# Patient Record
Sex: Female | Born: 1940 | Race: White | Hispanic: No | Smoking: Never smoker
Health system: Southern US, Community
[De-identification: ages and names within clinical notes are randomized; demographics above are authoritative.]

## PROBLEM LIST (undated history)

## (undated) DIAGNOSIS — C50912 Malignant neoplasm of unspecified site of left female breast: Secondary | ICD-10-CM

## (undated) DIAGNOSIS — M1990 Unspecified osteoarthritis, unspecified site: Secondary | ICD-10-CM

## (undated) DIAGNOSIS — I10 Essential (primary) hypertension: Secondary | ICD-10-CM

## (undated) HISTORY — PX: BREAST BIOPSY: SHX20

## (undated) HISTORY — PX: BUNIONECTOMY: SHX129

## (undated) HISTORY — PX: TONSILLECTOMY: SUR1361

## (undated) HISTORY — PX: CYST EXCISION: SHX5701

---

## 2000-05-02 ENCOUNTER — Encounter: Admission: RE | Admit: 2000-05-02 | Discharge: 2000-05-02 | Payer: Self-pay | Admitting: Internal Medicine

## 2000-05-02 ENCOUNTER — Encounter: Payer: Self-pay | Admitting: Internal Medicine

## 2001-05-15 ENCOUNTER — Encounter: Payer: Self-pay | Admitting: Internal Medicine

## 2001-05-15 ENCOUNTER — Encounter: Admission: RE | Admit: 2001-05-15 | Discharge: 2001-05-15 | Payer: Self-pay | Admitting: Internal Medicine

## 2002-02-05 ENCOUNTER — Ambulatory Visit (HOSPITAL_COMMUNITY): Admission: RE | Admit: 2002-02-05 | Discharge: 2002-02-05 | Payer: Self-pay | Admitting: *Deleted

## 2002-06-21 ENCOUNTER — Encounter: Admission: RE | Admit: 2002-06-21 | Discharge: 2002-06-21 | Payer: Self-pay | Admitting: *Deleted

## 2003-07-22 ENCOUNTER — Encounter: Admission: RE | Admit: 2003-07-22 | Discharge: 2003-07-22 | Payer: Self-pay | Admitting: Internal Medicine

## 2004-09-09 ENCOUNTER — Encounter: Admission: RE | Admit: 2004-09-09 | Discharge: 2004-09-09 | Payer: Self-pay | Admitting: Internal Medicine

## 2005-10-06 ENCOUNTER — Encounter: Admission: RE | Admit: 2005-10-06 | Discharge: 2005-10-06 | Payer: Self-pay | Admitting: Internal Medicine

## 2006-10-26 ENCOUNTER — Encounter: Admission: RE | Admit: 2006-10-26 | Discharge: 2006-10-26 | Payer: Self-pay | Admitting: Internal Medicine

## 2007-11-20 ENCOUNTER — Encounter: Admission: RE | Admit: 2007-11-20 | Discharge: 2007-11-20 | Payer: Self-pay | Admitting: Internal Medicine

## 2008-12-04 ENCOUNTER — Encounter: Admission: RE | Admit: 2008-12-04 | Discharge: 2008-12-04 | Payer: Self-pay | Admitting: Internal Medicine

## 2010-01-06 ENCOUNTER — Encounter: Admission: RE | Admit: 2010-01-06 | Discharge: 2010-01-06 | Payer: Self-pay | Admitting: Internal Medicine

## 2010-10-29 NOTE — Op Note (Signed)
   NAME:  Beth [name], Beth [name]                          ACCOUNT NO.:  [id number]   MEDICAL RECORD NO.:  [id number]                   PATIENT TYPE:  AMB   LOCATION:  ENDO                                 FACILITY:  [facility]   PHYSICIAN:  Georgiana Spinner, M.D.                 DATE OF BIRTH:  [birth year]   DATE OF PROCEDURE:  02/05/2002  DATE OF DISCHARGE:  02/05/2002                                 OPERATIVE REPORT   PROCEDURE:  Colonoscopy.   INDICATIONS:  Hemoccult positivity.   ANESTHESIA:  Demerol 90, Versed 9 mg.   PROCEDURE:  With the patient mildly sedated in the left lateral decubitus  position, a rectal examination was performed which revealed trace positive  material.  Subsequently the Olympus videoscopic colonoscope was inserted in  the rectum and passed under direct vision through a tortuous colon to the  cecum, identified by the ileocecal valve and appendiceal orifice both of  which were photographed.  From this point, the colonoscope was slowly  withdrawn to encircle around through his colonic mucosa stopping only in the  rectum which appeared normal.  Internal hemorrhoids on retroflex view and  the etiology of the hemoccult positivity may have been there is that there  was a small fissure.  The patient's vital signs and pulse oximeter remained  stable.  The patient tolerated the procedure well without apparent  complication.   FINDINGS:  1. Internal hemorrhoids.  2. Irritation of questionable fissure.  3. Tortuous colon.  4. Otherwise unremarkable examination.   PLAN:  The patient will followup with me as an outpatient.                                               Georgiana Spinner, M.D.    GMO/MEDQ  D:  02/05/2002  T:  02/07/2002  Job:  [id number]

## 2010-10-29 NOTE — Op Note (Signed)
   NAME:  Beth [name], Beth [name]                          ACCOUNT NO.:  [id number]   MEDICAL RECORD NO.:  [id number]                   PATIENT TYPE:  AMB   LOCATION:  ENDO                                 FACILITY:  [facility]   PHYSICIAN:  Georgiana Spinner, M.D.                 DATE OF BIRTH:  04/19/1941   DATE OF PROCEDURE:  02/05/2002  DATE OF DISCHARGE:                                 OPERATIVE REPORT   ADDENDUM   Add carbon copy.                                               Georgiana Spinner, M.D.    GMO/MEDQ  D:  02/05/2002  T:  02/07/2002  Job:  [id number]   cc:   Lacretia Leigh. Quintella Reichert, M.D.

## 2011-01-12 ENCOUNTER — Other Ambulatory Visit: Payer: Self-pay | Admitting: Internal Medicine

## 2011-01-12 DIAGNOSIS — Z1231 Encounter for screening mammogram for malignant neoplasm of breast: Secondary | ICD-10-CM

## 2011-01-26 ENCOUNTER — Ambulatory Visit
Admission: RE | Admit: 2011-01-26 | Discharge: 2011-01-26 | Disposition: A | Discharge status: Home / Self Care | Payer: Medicare Other | Source: Ambulatory Visit | Attending: Internal Medicine | Admitting: Internal Medicine

## 2011-01-26 DIAGNOSIS — Z1231 Encounter for screening mammogram for malignant neoplasm of breast: Secondary | ICD-10-CM

## 2011-08-17 DIAGNOSIS — E785 Hyperlipidemia, unspecified: Secondary | ICD-10-CM | POA: Diagnosis not present

## 2011-08-17 DIAGNOSIS — I10 Essential (primary) hypertension: Secondary | ICD-10-CM | POA: Diagnosis not present

## 2011-08-17 DIAGNOSIS — E559 Vitamin D deficiency, unspecified: Secondary | ICD-10-CM | POA: Diagnosis not present

## 2011-08-24 DIAGNOSIS — I10 Essential (primary) hypertension: Secondary | ICD-10-CM | POA: Diagnosis not present

## 2011-08-24 DIAGNOSIS — Z0000 Encounter for general adult medical examination without abnormal findings: Secondary | ICD-10-CM | POA: Diagnosis not present

## 2011-08-24 DIAGNOSIS — R7301 Impaired fasting glucose: Secondary | ICD-10-CM | POA: Diagnosis not present

## 2011-08-24 DIAGNOSIS — E785 Hyperlipidemia, unspecified: Secondary | ICD-10-CM | POA: Diagnosis not present

## 2012-02-29 ENCOUNTER — Other Ambulatory Visit: Payer: Self-pay | Admitting: Internal Medicine

## 2012-02-29 DIAGNOSIS — Z1231 Encounter for screening mammogram for malignant neoplasm of breast: Secondary | ICD-10-CM

## 2012-03-16 ENCOUNTER — Ambulatory Visit
Admission: RE | Admit: 2012-03-16 | Discharge: 2012-03-16 | Disposition: A | Discharge status: Home / Self Care | Payer: Medicare Other | Source: Ambulatory Visit | Attending: Internal Medicine | Admitting: Internal Medicine

## 2012-03-16 DIAGNOSIS — Z1231 Encounter for screening mammogram for malignant neoplasm of breast: Secondary | ICD-10-CM

## 2012-03-20 DIAGNOSIS — Z23 Encounter for immunization: Secondary | ICD-10-CM | POA: Diagnosis not present

## 2013-04-11 DIAGNOSIS — Z23 Encounter for immunization: Secondary | ICD-10-CM | POA: Diagnosis not present

## 2013-04-23 ENCOUNTER — Other Ambulatory Visit: Payer: Self-pay

## 2013-04-23 DIAGNOSIS — Z1231 Encounter for screening mammogram for malignant neoplasm of breast: Secondary | ICD-10-CM

## 2013-04-26 DIAGNOSIS — I10 Essential (primary) hypertension: Secondary | ICD-10-CM | POA: Diagnosis not present

## 2013-04-26 DIAGNOSIS — R7301 Impaired fasting glucose: Secondary | ICD-10-CM | POA: Diagnosis not present

## 2013-04-26 DIAGNOSIS — E785 Hyperlipidemia, unspecified: Secondary | ICD-10-CM | POA: Diagnosis not present

## 2013-04-26 DIAGNOSIS — E559 Vitamin D deficiency, unspecified: Secondary | ICD-10-CM | POA: Diagnosis not present

## 2013-05-02 DIAGNOSIS — R51 Headache: Secondary | ICD-10-CM | POA: Diagnosis not present

## 2013-05-02 DIAGNOSIS — I10 Essential (primary) hypertension: Secondary | ICD-10-CM | POA: Diagnosis not present

## 2013-05-02 DIAGNOSIS — R7301 Impaired fasting glucose: Secondary | ICD-10-CM | POA: Diagnosis not present

## 2013-05-02 DIAGNOSIS — Z1331 Encounter for screening for depression: Secondary | ICD-10-CM | POA: Diagnosis not present

## 2013-05-02 DIAGNOSIS — E785 Hyperlipidemia, unspecified: Secondary | ICD-10-CM | POA: Diagnosis not present

## 2013-05-02 DIAGNOSIS — M1990 Unspecified osteoarthritis, unspecified site: Secondary | ICD-10-CM | POA: Diagnosis not present

## 2013-05-02 DIAGNOSIS — Z1212 Encounter for screening for malignant neoplasm of rectum: Secondary | ICD-10-CM | POA: Diagnosis not present

## 2013-05-02 DIAGNOSIS — Z0000 Encounter for general adult medical examination without abnormal findings: Secondary | ICD-10-CM | POA: Diagnosis not present

## 2013-05-02 DIAGNOSIS — Z23 Encounter for immunization: Secondary | ICD-10-CM | POA: Diagnosis not present

## 2013-05-27 ENCOUNTER — Ambulatory Visit
Admission: RE | Admit: 2013-05-27 | Discharge: 2013-05-27 | Disposition: A | Discharge status: Home / Self Care | Payer: Medicare Other | Source: Ambulatory Visit

## 2013-05-27 DIAGNOSIS — Z1231 Encounter for screening mammogram for malignant neoplasm of breast: Secondary | ICD-10-CM | POA: Diagnosis not present

## 2013-07-25 DIAGNOSIS — H2510 Age-related nuclear cataract, unspecified eye: Secondary | ICD-10-CM | POA: Diagnosis not present

## 2013-07-25 DIAGNOSIS — H5200 Hypermetropia, unspecified eye: Secondary | ICD-10-CM | POA: Diagnosis not present

## 2013-07-25 DIAGNOSIS — R51 Headache: Secondary | ICD-10-CM | POA: Diagnosis not present

## 2013-07-25 DIAGNOSIS — H524 Presbyopia: Secondary | ICD-10-CM | POA: Diagnosis not present

## 2014-02-12 DIAGNOSIS — M1990 Unspecified osteoarthritis, unspecified site: Secondary | ICD-10-CM | POA: Diagnosis not present

## 2014-02-12 DIAGNOSIS — R7301 Impaired fasting glucose: Secondary | ICD-10-CM | POA: Diagnosis not present

## 2014-02-12 DIAGNOSIS — E785 Hyperlipidemia, unspecified: Secondary | ICD-10-CM | POA: Diagnosis not present

## 2014-02-12 DIAGNOSIS — I10 Essential (primary) hypertension: Secondary | ICD-10-CM | POA: Diagnosis not present

## 2014-02-12 DIAGNOSIS — Z6827 Body mass index (BMI) 27.0-27.9, adult: Secondary | ICD-10-CM | POA: Diagnosis not present

## 2014-03-20 DIAGNOSIS — Z23 Encounter for immunization: Secondary | ICD-10-CM | POA: Diagnosis not present

## 2014-05-30 ENCOUNTER — Encounter (HOSPITAL_COMMUNITY): Payer: Self-pay

## 2014-05-30 ENCOUNTER — Emergency Department (INDEPENDENT_AMBULATORY_CARE_PROVIDER_SITE_OTHER)
Admission: EM | Admit: 2014-05-30 | Discharge: 2014-05-30 | Disposition: A | Discharge status: Home / Self Care | Payer: Self-pay | Source: Home / Self Care | Attending: Family Medicine | Admitting: Family Medicine

## 2014-05-30 HISTORY — DX: Essential (primary) hypertension: I10

## 2014-05-30 NOTE — Discharge Instructions (Signed)
Use heat and tylenol as needed, routine activity and diet, return as needed
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
## 2014-06-17 ENCOUNTER — Emergency Department (HOSPITAL_COMMUNITY)
Admission: EM | Admit: 2014-06-17 | Discharge: 2014-06-17 | Disposition: A | Discharge status: Home / Self Care | Payer: Medicare Other | Attending: Emergency Medicine | Admitting: Emergency Medicine

## 2014-06-17 ENCOUNTER — Emergency Department (HOSPITAL_COMMUNITY): Payer: Medicare Other

## 2014-06-17 ENCOUNTER — Encounter (HOSPITAL_COMMUNITY): Payer: Self-pay | Admitting: Emergency Medicine

## 2014-06-17 DIAGNOSIS — Z79899 Other long term (current) drug therapy: Secondary | ICD-10-CM | POA: Insufficient documentation

## 2014-06-17 DIAGNOSIS — R079 Chest pain, unspecified: Secondary | ICD-10-CM | POA: Diagnosis not present

## 2014-06-17 DIAGNOSIS — I10 Essential (primary) hypertension: Secondary | ICD-10-CM | POA: Diagnosis not present

## 2014-06-17 DIAGNOSIS — G8911 Acute pain due to trauma: Secondary | ICD-10-CM | POA: Diagnosis not present

## 2014-06-17 DIAGNOSIS — S299XXA Unspecified injury of thorax, initial encounter: Secondary | ICD-10-CM | POA: Diagnosis not present

## 2014-06-17 MED ORDER — OXYCODONE-ACETAMINOPHEN 5-325 MG PO TABS: 1.0000 | ORAL_TABLET | Freq: Three times a day (TID) | ORAL | Status: DC | PRN

## 2014-06-17 NOTE — Discharge Instructions (Signed)
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
## 2014-08-06 ENCOUNTER — Other Ambulatory Visit: Payer: Self-pay

## 2014-08-06 DIAGNOSIS — E559 Vitamin D deficiency, unspecified: Secondary | ICD-10-CM | POA: Diagnosis not present

## 2014-08-06 DIAGNOSIS — I10 Essential (primary) hypertension: Secondary | ICD-10-CM | POA: Diagnosis not present

## 2014-08-06 DIAGNOSIS — Z1231 Encounter for screening mammogram for malignant neoplasm of breast: Secondary | ICD-10-CM

## 2014-08-06 DIAGNOSIS — R7301 Impaired fasting glucose: Secondary | ICD-10-CM | POA: Diagnosis not present

## 2014-08-06 DIAGNOSIS — E785 Hyperlipidemia, unspecified: Secondary | ICD-10-CM | POA: Diagnosis not present

## 2014-08-13 DIAGNOSIS — Z0000 Encounter for general adult medical examination without abnormal findings: Secondary | ICD-10-CM | POA: Diagnosis not present

## 2014-08-13 DIAGNOSIS — E785 Hyperlipidemia, unspecified: Secondary | ICD-10-CM | POA: Diagnosis not present

## 2014-08-13 DIAGNOSIS — Z1389 Encounter for screening for other disorder: Secondary | ICD-10-CM | POA: Diagnosis not present

## 2014-08-13 DIAGNOSIS — M1990 Unspecified osteoarthritis, unspecified site: Secondary | ICD-10-CM | POA: Diagnosis not present

## 2014-08-13 DIAGNOSIS — I10 Essential (primary) hypertension: Secondary | ICD-10-CM | POA: Diagnosis not present

## 2014-08-13 DIAGNOSIS — R7301 Impaired fasting glucose: Secondary | ICD-10-CM | POA: Diagnosis not present

## 2014-08-13 DIAGNOSIS — Z6826 Body mass index (BMI) 26.0-26.9, adult: Secondary | ICD-10-CM | POA: Diagnosis not present

## 2014-08-14 ENCOUNTER — Ambulatory Visit
Admission: RE | Admit: 2014-08-14 | Discharge: 2014-08-14 | Disposition: A | Discharge status: Home / Self Care | Payer: Medicare Other | Source: Ambulatory Visit

## 2014-08-14 DIAGNOSIS — Z1231 Encounter for screening mammogram for malignant neoplasm of breast: Secondary | ICD-10-CM

## 2014-09-11 DIAGNOSIS — H2513 Age-related nuclear cataract, bilateral: Secondary | ICD-10-CM | POA: Diagnosis not present

## 2014-11-11 DIAGNOSIS — L570 Actinic keratosis: Secondary | ICD-10-CM | POA: Diagnosis not present

## 2014-11-11 DIAGNOSIS — L821 Other seborrheic keratosis: Secondary | ICD-10-CM | POA: Diagnosis not present

## 2014-11-11 DIAGNOSIS — L820 Inflamed seborrheic keratosis: Secondary | ICD-10-CM | POA: Diagnosis not present

## 2014-12-02 DIAGNOSIS — L820 Inflamed seborrheic keratosis: Secondary | ICD-10-CM | POA: Diagnosis not present

## 2014-12-02 DIAGNOSIS — D485 Neoplasm of uncertain behavior of skin: Secondary | ICD-10-CM | POA: Diagnosis not present

## 2014-12-02 DIAGNOSIS — D1801 Hemangioma of skin and subcutaneous tissue: Secondary | ICD-10-CM | POA: Diagnosis not present

## 2014-12-16 DIAGNOSIS — L820 Inflamed seborrheic keratosis: Secondary | ICD-10-CM | POA: Diagnosis not present

## 2015-03-02 DIAGNOSIS — M1990 Unspecified osteoarthritis, unspecified site: Secondary | ICD-10-CM | POA: Diagnosis not present

## 2015-03-02 DIAGNOSIS — I10 Essential (primary) hypertension: Secondary | ICD-10-CM | POA: Diagnosis not present

## 2015-03-02 DIAGNOSIS — Z6827 Body mass index (BMI) 27.0-27.9, adult: Secondary | ICD-10-CM | POA: Diagnosis not present

## 2015-03-02 DIAGNOSIS — E785 Hyperlipidemia, unspecified: Secondary | ICD-10-CM | POA: Diagnosis not present

## 2015-03-02 DIAGNOSIS — Z23 Encounter for immunization: Secondary | ICD-10-CM | POA: Diagnosis not present

## 2015-03-02 DIAGNOSIS — G542 Cervical root disorders, not elsewhere classified: Secondary | ICD-10-CM | POA: Diagnosis not present

## 2015-03-02 DIAGNOSIS — E7439 Other disorders of intestinal carbohydrate absorption: Secondary | ICD-10-CM | POA: Diagnosis not present

## 2015-09-09 ENCOUNTER — Other Ambulatory Visit: Payer: Self-pay

## 2015-09-09 DIAGNOSIS — Z1231 Encounter for screening mammogram for malignant neoplasm of breast: Secondary | ICD-10-CM

## 2015-09-23 ENCOUNTER — Ambulatory Visit
Admission: RE | Admit: 2015-09-23 | Discharge: 2015-09-23 | Disposition: A | Discharge status: Home / Self Care | Payer: Medicare Other | Source: Ambulatory Visit

## 2015-09-23 DIAGNOSIS — Z1231 Encounter for screening mammogram for malignant neoplasm of breast: Secondary | ICD-10-CM

## 2015-10-19 DIAGNOSIS — H2513 Age-related nuclear cataract, bilateral: Secondary | ICD-10-CM | POA: Diagnosis not present

## 2015-10-19 DIAGNOSIS — Z0100 Encounter for examination of eyes and vision without abnormal findings: Secondary | ICD-10-CM | POA: Diagnosis not present

## 2016-02-05 DIAGNOSIS — R1011 Right upper quadrant pain: Secondary | ICD-10-CM | POA: Diagnosis not present

## 2016-02-05 DIAGNOSIS — Z6828 Body mass index (BMI) 28.0-28.9, adult: Secondary | ICD-10-CM | POA: Diagnosis not present

## 2016-02-05 DIAGNOSIS — Z1211 Encounter for screening for malignant neoplasm of colon: Secondary | ICD-10-CM | POA: Diagnosis not present

## 2016-02-05 DIAGNOSIS — R1030 Lower abdominal pain, unspecified: Secondary | ICD-10-CM | POA: Diagnosis not present

## 2016-02-05 DIAGNOSIS — I10 Essential (primary) hypertension: Secondary | ICD-10-CM | POA: Diagnosis not present

## 2016-02-09 ENCOUNTER — Encounter: Payer: Self-pay | Admitting: Internal Medicine

## 2016-02-11 DIAGNOSIS — K8020 Calculus of gallbladder without cholecystitis without obstruction: Secondary | ICD-10-CM | POA: Diagnosis not present

## 2016-02-19 DIAGNOSIS — Z23 Encounter for immunization: Secondary | ICD-10-CM | POA: Diagnosis not present

## 2016-02-24 ENCOUNTER — Ambulatory Visit: Payer: Self-pay | Admitting: Surgery

## 2016-02-24 DIAGNOSIS — K8010 Calculus of gallbladder with chronic cholecystitis without obstruction: Secondary | ICD-10-CM | POA: Diagnosis not present

## 2016-03-16 ENCOUNTER — Encounter (HOSPITAL_COMMUNITY): Payer: Self-pay

## 2016-03-16 ENCOUNTER — Encounter (HOSPITAL_COMMUNITY)
Admission: RE | Admit: 2016-03-16 | Discharge: 2016-03-16 | Disposition: A | Discharge status: Home / Self Care | Payer: Medicare Other | Source: Ambulatory Visit | Attending: Surgery | Admitting: Surgery

## 2016-03-16 DIAGNOSIS — K8010 Calculus of gallbladder with chronic cholecystitis without obstruction: Secondary | ICD-10-CM | POA: Diagnosis not present

## 2016-03-16 DIAGNOSIS — Z01812 Encounter for preprocedural laboratory examination: Secondary | ICD-10-CM | POA: Diagnosis not present

## 2016-03-16 DIAGNOSIS — Z01810 Encounter for preprocedural cardiovascular examination: Secondary | ICD-10-CM | POA: Diagnosis not present

## 2016-03-16 LAB — CBC
HCT: 35.2 % — ABNORMAL LOW (ref 36.0–46.0)
HEMOGLOBIN: 11.6 g/dL — AB (ref 12.0–15.0)
MCH: 30.3 pg (ref 26.0–34.0)
MCHC: 33 g/dL (ref 30.0–36.0)
MCV: 91.9 fL (ref 78.0–100.0)
Platelets: 270 10*3/uL (ref 150–400)
RBC: 3.83 MIL/uL — AB (ref 3.87–5.11)
RDW: 12.5 % (ref 11.5–15.5)
WBC: 8.3 10*3/uL (ref 4.0–10.5)

## 2016-03-16 LAB — BASIC METABOLIC PANEL
ANION GAP: 7 (ref 5–15)
BUN: 22 mg/dL — ABNORMAL HIGH (ref 6–20)
CALCIUM: 9.6 mg/dL (ref 8.9–10.3)
CO2: 26 mmol/L (ref 22–32)
Chloride: 105 mmol/L (ref 101–111)
Creatinine, Ser: 1.05 mg/dL — ABNORMAL HIGH (ref 0.44–1.00)
GFR, EST AFRICAN AMERICAN: 58 mL/min — AB (ref >60)
GFR, EST NON AFRICAN AMERICAN: 50 mL/min — AB (ref >60)
Glucose, Bld: 93 mg/dL (ref 65–99)
Potassium: 4.2 mmol/L (ref 3.5–5.1)
Sodium: 138 mmol/L (ref 135–145)

## 2016-03-16 NOTE — Patient Instructions (Signed)
Beth [name]  03/16/2016   Your procedure is scheduled on: 03-22-16  Report to [facility] Main  Entrance take [facility]  elevators to 3rd floor to  [place] at   [place] AM.  Call this number if you have problems the morning of surgery [phone]   Remember: ONLY 1 PERSON MAY GO WITH YOU TO SHORT STAY TO GET  READY MORNING OF YOUR SURGERY.  Do not eat food or drink liquids :After Midnight.     Take these medicines the morning of surgery with A SIP OF WATER: NONE. DO NOT TAKE ANY DIABETIC MEDICATIONS DAY OF YOUR SURGERY                               You may not have any metal on your body including hair pins and              piercings  Do not wear jewelry, make-up, lotions, powders or perfumes, deodorant             Do not wear nail polish.  Do not shave  48 hours prior to surgery.              Men may shave face and neck.   Do not bring valuables to the hospital. [place].  Contacts, dentures or bridgework may not be worn into surgery.  Leave suitcase in the car. After surgery it may be brought to your room.     Patients discharged the day of surgery will not be allowed to drive home.  Name and phone number of your driver: Ihor Dow  Special Instructions: N/A              Please read over the following fact sheets you were given: _____________________________________________________________________             [facility] - Preparing for Surgery Before surgery, you can play an important role.  Because skin is not sterile, your skin needs to be as free of germs as possible.  You can reduce the number of germs on your skin by washing with CHG (chlorahexidine gluconate) soap before surgery.  CHG is an antiseptic cleaner which kills germs and bonds with the skin to continue killing germs even after washing. Please DO NOT use if you have an allergy to CHG or antibacterial soaps.  If your skin  becomes reddened/irritated stop using the CHG and inform your nurse when you arrive at Short Stay. Do not shave (including legs and underarms) for at least 48 hours prior to the first CHG shower.  You may shave your face/neck. Please follow these instructions carefully:  1.  Shower with CHG Soap the night before surgery and the  morning of Surgery.  2.  If you choose to wash your hair, wash your hair first as usual with your  normal  shampoo.  3.  After you shampoo, rinse your hair and body thoroughly to remove the  shampoo.                           4.  Use CHG as you would any other liquid soap.  You can apply chg directly  to the skin  and wash                       Gently with a scrungie or clean washcloth.  5.  Apply the CHG Soap to your body ONLY FROM THE NECK DOWN.   Do not use on face/ open                           Wound or open sores. Avoid contact with eyes, ears mouth and genitals (private parts).                       Wash face,  Genitals (private parts) with your normal soap.             6.  Wash thoroughly, paying special attention to the area where your surgery  will be performed.  7.  Thoroughly rinse your body with warm water from the neck down.  8.  DO NOT shower/wash with your normal soap after using and rinsing off  the CHG Soap.                9.  Pat yourself dry with a clean towel.            10.  Wear clean pajamas.            11.  Place clean sheets on your bed the night of your first shower and do not  sleep with pets. Day of Surgery : Do not apply any lotions/deodorants the morning of surgery.  Please wear clean clothes to the hospital/surgery center.  FAILURE TO FOLLOW THESE INSTRUCTIONS MAY RESULT IN THE CANCELLATION OF YOUR SURGERY PATIENT SIGNATURE_________________________________  NURSE SIGNATURE__________________________________  ________________________________________________________________________

## 2016-03-20 ENCOUNTER — Encounter (HOSPITAL_COMMUNITY): Payer: Self-pay | Admitting: Surgery

## 2016-03-20 DIAGNOSIS — K8010 Calculus of gallbladder with chronic cholecystitis without obstruction: Secondary | ICD-10-CM | POA: Diagnosis present

## 2016-03-20 NOTE — H&P (Signed)
General Surgery [facility]  Beth [name] DOB: 11-Mar-1941 Widowed / Language: Cleophus Molt / Race: White Female  History of Present Illness   Patient words: gallbladder.  The patient is a 75 year old female who presents for evaluation of gall stones.  Patient is referred by Dr. Burnard Bunting and Avanell Shackleton for evaluation of symptomatic cholelithiasis and chronic cholecystitis. Patient began having symptoms approximately 6 months ago. She noted pain after meals. It did not really matter what type of food. She denies any nausea or vomiting. She denies fevers or chills. She took an antacid medication which helped somewhat. She has had no prior abdominal surgery. She has had multiple family members who have had cholecystectomy. Patient denies any history of hepatobiliary or pancreatic disease. She denies jaundice or acholic stools.   Other Problems Arthritis Cholelithiasis High blood pressure Hypercholesterolemia Lump In Breast  Past Surgical History Breast Mass; Local Excision Bilateral. Foot Surgery Bilateral. Oral Surgery Tonsillectomy  Diagnostic Studies History Colonoscopy >10 years ago Mammogram within last year Pap Smear >5 years ago  Allergies No Known Allergies09/13/2017  Medication History  Lisinopril (20MG  Tablet, Oral) Active. Omeprazole (20MG  Capsule DR, Oral) Active. Medications Reconciled  Social History Alcohol use Moderate alcohol use. Caffeine use Coffee, Tea. Tobacco use Never smoker.  Family History Breast Cancer Family Members In General. Cervical Cancer Family Members In General. Colon Polyps Sister. Depression Mother. Respiratory Condition Brother.  Pregnancy / Birth History Age at menarche 55 years. Age of menopause <43 Contraceptive History Oral contraceptives. Gravida 0 Para 0    Review of Systems General Present- Fatigue and Weight Gain. Not Present- Appetite Loss,  Chills, Fever, Night Sweats and Weight Loss. Skin Not Present- Change in Wart/Mole, Dryness, Hives, Jaundice, New Lesions, Non-Healing Wounds, Rash and Ulcer. HEENT Present- Ringing in the Ears and Wears glasses/contact lenses. Not Present- Earache, Hearing Loss, Hoarseness, Nose Bleed, Oral Ulcers, Seasonal Allergies, Sinus Pain, Sore Throat, Visual Disturbances and Yellow Eyes. Respiratory Not Present- Bloody sputum, Chronic Cough, Difficulty Breathing, Snoring and Wheezing. Breast Not Present- Breast Mass, Breast Pain, Nipple Discharge and Skin Changes. Cardiovascular Not Present- Chest Pain, Difficulty Breathing Lying Down, Leg Cramps, Palpitations, Rapid Heart Rate, Shortness of Breath and Swelling of Extremities. Gastrointestinal Present- Abdominal Pain, Excessive gas and Gets full quickly at meals. Not Present- Bloating, Bloody Stool, Change in Bowel Habits, Chronic diarrhea, Constipation, Difficulty Swallowing, Hemorrhoids, Indigestion, Nausea, Rectal Pain and Vomiting. Female Genitourinary Not Present- Frequency, Nocturia, Painful Urination, Pelvic Pain and Urgency. Musculoskeletal Present- Joint Pain. Not Present- Back Pain, Joint Stiffness, Muscle Pain, Muscle Weakness and Swelling of Extremities. Psychiatric Not Present- Anxiety, Bipolar, Change in Sleep Pattern, Depression, Fearful and Frequent crying. Endocrine Not Present- Cold Intolerance, Excessive Hunger, Hair Changes, Heat Intolerance, Hot flashes and New Diabetes. Hematology Not Present- Blood Thinners, Easy Bruising, Excessive bleeding, Gland problems, HIV and Persistent Infections.  Vitals Weight: 155.4 lb Height: 63in Body Surface Area: 1.74 m Body Mass Index: 27.53 kg/m  Temp.: 97.77F(Oral)  Pulse: 71 (Regular)  BP: 120/72 (Sitting, Left Arm, Standard)   Physical Exam The physical exam findings are as follows: Note:General - appears comfortable, no distress; not diaphorectic  HEENT - normocephalic;  sclerae clear, gaze conjugate; mucous membranes moist, dentition good; voice normal  Neck - symmetric on extension; no palpable anterior or posterior cervical adenopathy; no palpable masses in the thyroid bed  Chest - clear bilaterally without rhonchi, rales, or wheeze  Cor - regular rhythm with normal rate; no significant murmur  Abd - soft without distension; no surgical incisions; no hepatosplenomegaly; no tenderness  Ext - non-tender without significant edema or lymphedema  Neuro - grossly intact; no tremor   Assessment & Plan  CALCULUS OF GALLBLADDER WITH CHRONIC CHOLECYSTITIS WITHOUT OBSTRUCTION (K80.10)  Patient presents on referral from her primary care provider for treatment of symptomatic cholelithiasis. Patient is given written literature on gallbladder surgery to review at home.  I have recommended laparoscopic cholecystectomy with intraoperative cholangiography. We have discussed the procedure. We have discussed the hospital stay. We have discussed her postoperative recovery and return to normal activity. Patient understands and wishes to proceed with surgery in the near future. We will make arrangements at a time convenient for the patient.  The risks and benefits of the procedure have been discussed at length with the patient. The patient understands the proposed procedure, potential alternative treatments, and the course of recovery to be expected. All of the patient's questions have been answered at this time. The patient wishes to proceed with surgery.  Earnstine Regal, MD, [facility] Office: [phone]

## 2016-03-21 NOTE — Anesthesia Preprocedure Evaluation (Signed)
Anesthesia Evaluation  Patient identified by MRN, date of birth, ID band Patient awake    Reviewed: Allergy & Precautions, NPO status , Patient's Chart, lab work & pertinent test results  Airway Mallampati: II  TM Distance: >3 FB Neck ROM: Full    Dental no notable dental hx.    Pulmonary neg pulmonary ROS,    Pulmonary exam normal breath sounds clear to auscultation       Cardiovascular hypertension, negative cardio ROS Normal cardiovascular exam Rhythm:Regular Rate:Normal     Neuro/Psych negative neurological ROS  negative psych ROS   GI/Hepatic negative GI ROS, Neg liver ROS,   Endo/Other  negative endocrine ROS  Renal/GU negative Renal ROS     Musculoskeletal negative musculoskeletal ROS (+)   Abdominal   Peds  Hematology negative hematology ROS (+)   Anesthesia Other Findings   Reproductive/Obstetrics negative OB ROS                             Anesthesia Physical Anesthesia Plan  ASA: II  Anesthesia Plan: General   Post-op Pain Management:    Induction: Intravenous  Airway Management Planned: Oral ETT  Additional Equipment:   Intra-op Plan:   Post-operative Plan: Extubation in OR  Informed Consent: I have reviewed the patients History and Physical, chart, labs and discussed the procedure including the risks, benefits and alternatives for the proposed anesthesia with the patient or authorized representative who has indicated his/her understanding and acceptance.   Dental advisory given  Plan Discussed with: CRNA  Anesthesia Plan Comments:         Anesthesia Quick Evaluation

## 2016-03-22 ENCOUNTER — Observation Stay (HOSPITAL_COMMUNITY)
Admission: RE | Admit: 2016-03-22 | Discharge: 2016-03-23 | Disposition: A | Discharge status: Home / Self Care | Payer: Medicare Other | Source: Ambulatory Visit | Attending: Surgery | Admitting: Surgery

## 2016-03-22 ENCOUNTER — Ambulatory Visit (HOSPITAL_COMMUNITY): Payer: Medicare Other | Admitting: Anesthesiology

## 2016-03-22 ENCOUNTER — Encounter (HOSPITAL_COMMUNITY)
Admission: RE | Disposition: A | Discharge status: Home / Self Care | Payer: Self-pay | Source: Ambulatory Visit | Attending: Surgery

## 2016-03-22 ENCOUNTER — Encounter (HOSPITAL_COMMUNITY): Payer: Self-pay | Admitting: *Deleted

## 2016-03-22 ENCOUNTER — Ambulatory Visit (HOSPITAL_COMMUNITY): Payer: Medicare Other

## 2016-03-22 DIAGNOSIS — K811 Chronic cholecystitis: Secondary | ICD-10-CM | POA: Diagnosis not present

## 2016-03-22 DIAGNOSIS — K8010 Calculus of gallbladder with chronic cholecystitis without obstruction: Secondary | ICD-10-CM | POA: Diagnosis not present

## 2016-03-22 DIAGNOSIS — I10 Essential (primary) hypertension: Secondary | ICD-10-CM | POA: Insufficient documentation

## 2016-03-22 DIAGNOSIS — K8080 Other cholelithiasis without obstruction: Secondary | ICD-10-CM | POA: Diagnosis not present

## 2016-03-22 DIAGNOSIS — Z419 Encounter for procedure for purposes other than remedying health state, unspecified: Secondary | ICD-10-CM

## 2016-03-22 DIAGNOSIS — E7800 Pure hypercholesterolemia, unspecified: Secondary | ICD-10-CM | POA: Insufficient documentation

## 2016-03-22 DIAGNOSIS — K8020 Calculus of gallbladder without cholecystitis without obstruction: Secondary | ICD-10-CM | POA: Diagnosis not present

## 2016-03-22 HISTORY — PX: CHOLECYSTECTOMY: SHX55

## 2016-03-22 SURGERY — LAPAROSCOPIC CHOLECYSTECTOMY WITH INTRAOPERATIVE CHOLANGIOGRAM
Anesthesia: General | Site: Abdomen

## 2016-03-22 MED ORDER — HYDROMORPHONE HCL 1 MG/ML IJ SOLN
0.2500 mg | INTRAMUSCULAR | Status: DC | PRN
  Administered 2016-03-22 (×2): 0.5 mg via INTRAVENOUS

## 2016-03-22 MED ORDER — CEFAZOLIN SODIUM-DEXTROSE 2-4 GM/100ML-% IV SOLN
2.0000 g | INTRAVENOUS | Status: AC
  Administered 2016-03-22: 2 g via INTRAVENOUS

## 2016-03-22 MED ORDER — IOPAMIDOL (ISOVUE-300) INJECTION 61%
INTRAVENOUS | Status: DC | PRN
  Administered 2016-03-22: 50 mL via INTRAVENOUS

## 2016-03-22 MED ORDER — HYDROCODONE-ACETAMINOPHEN 5-325 MG PO TABS: 1.0000 | ORAL_TABLET | ORAL | Status: DC | PRN

## 2016-03-22 MED ORDER — 0.9 % SODIUM CHLORIDE (POUR BTL) OPTIME
TOPICAL | Status: DC | PRN
  Administered 2016-03-22: 1000 mL

## 2016-03-22 MED ORDER — ACETAMINOPHEN 10 MG/ML IV SOLN
INTRAVENOUS | Status: AC
  Filled 2016-03-22: qty 100

## 2016-03-22 MED ORDER — LIDOCAINE 2% (20 MG/ML) 5 ML SYRINGE
INTRAMUSCULAR | Status: AC
  Filled 2016-03-22: qty 5

## 2016-03-22 MED ORDER — LACTATED RINGERS IV SOLN
INTRAVENOUS | Status: DC | PRN
  Administered 2016-03-22: 07:00:00 via INTRAVENOUS

## 2016-03-22 MED ORDER — EPHEDRINE 5 MG/ML INJ
INTRAVENOUS | Status: AC
  Filled 2016-03-22: qty 10

## 2016-03-22 MED ORDER — SUGAMMADEX SODIUM 200 MG/2ML IV SOLN
INTRAVENOUS | Status: DC | PRN
  Administered 2016-03-22: 200 mg via INTRAVENOUS

## 2016-03-22 MED ORDER — CHLORHEXIDINE GLUCONATE CLOTH 2 % EX PADS: 6.0000 | MEDICATED_PAD | Freq: Once | CUTANEOUS | Status: DC

## 2016-03-22 MED ORDER — SUCCINYLCHOLINE CHLORIDE 20 MG/ML IJ SOLN
INTRAMUSCULAR | Status: AC
  Filled 2016-03-22: qty 1

## 2016-03-22 MED ORDER — BUPIVACAINE HCL (PF) 0.5 % IJ SOLN
INTRAMUSCULAR | Status: AC
  Filled 2016-03-22: qty 30

## 2016-03-22 MED ORDER — SUCCINYLCHOLINE CHLORIDE 200 MG/10ML IV SOSY
PREFILLED_SYRINGE | INTRAVENOUS | Status: DC | PRN
  Administered 2016-03-22: 100 mg via INTRAVENOUS

## 2016-03-22 MED ORDER — ROCURONIUM BROMIDE 10 MG/ML (PF) SYRINGE
PREFILLED_SYRINGE | INTRAVENOUS | Status: AC
  Filled 2016-03-22: qty 10

## 2016-03-22 MED ORDER — FENTANYL CITRATE (PF) 100 MCG/2ML IJ SOLN
INTRAMUSCULAR | Status: DC | PRN
  Administered 2016-03-22 (×2): 50 ug via INTRAVENOUS

## 2016-03-22 MED ORDER — MEPERIDINE HCL 50 MG/ML IJ SOLN
6.2500 mg | INTRAMUSCULAR | Status: DC | PRN
Start: 2016-03-22 — End: 2016-03-22

## 2016-03-22 MED ORDER — BUPIVACAINE HCL (PF) 0.5 % IJ SOLN
INTRAMUSCULAR | Status: DC | PRN
  Administered 2016-03-22: 20 mL

## 2016-03-22 MED ORDER — FAMOTIDINE 20 MG PO TABS
20.0000 mg | ORAL_TABLET | Freq: Every day | ORAL | Status: DC
  Administered 2016-03-22 – 2016-03-23 (×2): 20 mg via ORAL
  Filled 2016-03-22 (×2): qty 1

## 2016-03-22 MED ORDER — ONDANSETRON HCL 4 MG/2ML IJ SOLN
INTRAMUSCULAR | Status: AC
  Filled 2016-03-22: qty 2

## 2016-03-22 MED ORDER — ACETAMINOPHEN 650 MG RE SUPP: 650.0000 mg | Freq: Four times a day (QID) | RECTAL | Status: DC | PRN

## 2016-03-22 MED ORDER — EPHEDRINE SULFATE 50 MG/ML IJ SOLN
INTRAMUSCULAR | Status: DC | PRN
  Administered 2016-03-22: 10 mg via INTRAVENOUS

## 2016-03-22 MED ORDER — PHENYLEPHRINE HCL 10 MG/ML IJ SOLN
INTRAMUSCULAR | Status: DC | PRN
  Administered 2016-03-22: 40 ug via INTRAVENOUS

## 2016-03-22 MED ORDER — PROPOFOL 10 MG/ML IV BOLUS
INTRAVENOUS | Status: AC
  Filled 2016-03-22: qty 20

## 2016-03-22 MED ORDER — ACETAMINOPHEN 325 MG PO TABS
650.0000 mg | ORAL_TABLET | Freq: Four times a day (QID) | ORAL | Status: DC | PRN
  Administered 2016-03-22 (×2): 650 mg via ORAL
  Filled 2016-03-22 (×2): qty 2

## 2016-03-22 MED ORDER — DEXAMETHASONE SODIUM PHOSPHATE 10 MG/ML IJ SOLN
INTRAMUSCULAR | Status: DC | PRN
  Administered 2016-03-22: 10 mg via INTRAVENOUS

## 2016-03-22 MED ORDER — LISINOPRIL 20 MG PO TABS
20.0000 mg | ORAL_TABLET | Freq: Every morning | ORAL | Status: DC
  Administered 2016-03-23: 20 mg via ORAL
  Filled 2016-03-22: qty 1

## 2016-03-22 MED ORDER — ONDANSETRON 4 MG PO TBDP: 4.0000 mg | ORAL_TABLET | Freq: Four times a day (QID) | ORAL | Status: DC | PRN

## 2016-03-22 MED ORDER — KCL IN DEXTROSE-NACL 20-5-0.45 MEQ/L-%-% IV SOLN
INTRAVENOUS | Status: DC
  Administered 2016-03-22: 11:00:00 via INTRAVENOUS
  Filled 2016-03-22 (×2): qty 1000

## 2016-03-22 MED ORDER — ONDANSETRON HCL 4 MG/2ML IJ SOLN
INTRAMUSCULAR | Status: DC | PRN
  Administered 2016-03-22: 4 mg via INTRAVENOUS

## 2016-03-22 MED ORDER — PROMETHAZINE HCL 25 MG/ML IJ SOLN: 6.2500 mg | INTRAMUSCULAR | Status: DC | PRN

## 2016-03-22 MED ORDER — DEXAMETHASONE SODIUM PHOSPHATE 10 MG/ML IJ SOLN
INTRAMUSCULAR | Status: AC
  Filled 2016-03-22: qty 1

## 2016-03-22 MED ORDER — IOPAMIDOL (ISOVUE-300) INJECTION 61%
INTRAVENOUS | Status: AC
  Filled 2016-03-22: qty 50

## 2016-03-22 MED ORDER — PHENYLEPHRINE 40 MCG/ML (10ML) SYRINGE FOR IV PUSH (FOR BLOOD PRESSURE SUPPORT)
PREFILLED_SYRINGE | INTRAVENOUS | Status: AC
  Filled 2016-03-22: qty 10

## 2016-03-22 MED ORDER — PROPOFOL 10 MG/ML IV BOLUS
INTRAVENOUS | Status: DC | PRN
  Administered 2016-03-22: 120 mg via INTRAVENOUS

## 2016-03-22 MED ORDER — CEFAZOLIN SODIUM-DEXTROSE 2-4 GM/100ML-% IV SOLN
INTRAVENOUS | Status: AC
  Filled 2016-03-22: qty 100

## 2016-03-22 MED ORDER — ACETAMINOPHEN 10 MG/ML IV SOLN
1000.0000 mg | Freq: Once | INTRAVENOUS | Status: AC
Start: 2016-03-22 — End: 2016-03-22
  Administered 2016-03-22: 1000 mg via INTRAVENOUS

## 2016-03-22 MED ORDER — HYDROMORPHONE HCL 1 MG/ML IJ SOLN: 1.0000 mg | INTRAMUSCULAR | Status: DC | PRN

## 2016-03-22 MED ORDER — ONDANSETRON HCL 4 MG/2ML IJ SOLN: 4.0000 mg | Freq: Four times a day (QID) | INTRAMUSCULAR | Status: DC | PRN

## 2016-03-22 MED ORDER — SUGAMMADEX SODIUM 200 MG/2ML IV SOLN
INTRAVENOUS | Status: AC
  Filled 2016-03-22: qty 2

## 2016-03-22 MED ORDER — FENTANYL CITRATE (PF) 100 MCG/2ML IJ SOLN
INTRAMUSCULAR | Status: AC
  Filled 2016-03-22: qty 2

## 2016-03-22 MED ORDER — LACTATED RINGERS IV SOLN
INTRAVENOUS | Status: DC
  Administered 2016-03-22: 09:00:00 via INTRAVENOUS

## 2016-03-22 MED ORDER — HYDROMORPHONE HCL 1 MG/ML IJ SOLN
INTRAMUSCULAR | Status: AC
  Filled 2016-03-22: qty 1

## 2016-03-22 MED ORDER — ROCURONIUM BROMIDE 10 MG/ML (PF) SYRINGE
PREFILLED_SYRINGE | INTRAVENOUS | Status: DC | PRN
  Administered 2016-03-22: 5 mg via INTRAVENOUS
  Administered 2016-03-22: 30 mg via INTRAVENOUS

## 2016-03-22 MED ORDER — HYDROCODONE-ACETAMINOPHEN 5-325 MG PO TABS: 1.0000 | ORAL_TABLET | ORAL | 0 refills | Status: DC | PRN

## 2016-03-22 MED ORDER — LIDOCAINE 2% (20 MG/ML) 5 ML SYRINGE
INTRAMUSCULAR | Status: DC | PRN
  Administered 2016-03-22: 60 mg via INTRAVENOUS

## 2016-03-22 SURGICAL SUPPLY — 34 items
APPLIER CLIP ROT 10 11.4 M/L (STAPLE) ×3
BENZOIN TINCTURE PRP APPL 2/3 (GAUZE/BANDAGES/DRESSINGS) ×3 IMPLANT
CABLE HIGH FREQUENCY MONO STRZ (ELECTRODE) ×3 IMPLANT
CHLORAPREP W/TINT 26ML (MISCELLANEOUS) ×3 IMPLANT
CLIP APPLIE ROT 10 11.4 M/L (STAPLE) ×1 IMPLANT
CLOSURE WOUND 1/2 X4 (GAUZE/BANDAGES/DRESSINGS) ×1
COVER MAYO STAND STRL (DRAPES) ×3 IMPLANT
COVER SURGICAL LIGHT HANDLE (MISCELLANEOUS) ×3 IMPLANT
DECANTER SPIKE VIAL GLASS SM (MISCELLANEOUS) IMPLANT
DRAPE C-ARM 42X120 X-RAY (DRAPES) ×3 IMPLANT
ELECT REM PT RETURN 9FT ADLT (ELECTROSURGICAL) ×3
ELECTRODE REM PT RTRN 9FT ADLT (ELECTROSURGICAL) ×1 IMPLANT
GAUZE SPONGE 2X2 8PLY STRL LF (GAUZE/BANDAGES/DRESSINGS) ×1 IMPLANT
GLOVE SURG ORTHO 8.0 STRL STRW (GLOVE) ×3 IMPLANT
GOWN STRL REUS W/TWL XL LVL3 (GOWN DISPOSABLE) ×12 IMPLANT
HEMOSTAT SURGICEL 4X8 (HEMOSTASIS) IMPLANT
IRRIG SUCT STRYKERFLOW 2 WTIP (MISCELLANEOUS) ×3
IRRIGATION SUCT STRKRFLW 2 WTP (MISCELLANEOUS) ×1 IMPLANT
KIT BASIN OR (CUSTOM PROCEDURE TRAY) ×3 IMPLANT
POUCH SPECIMEN RETRIEVAL 10MM (ENDOMECHANICALS) ×3 IMPLANT
SCISSORS LAP 5X35 DISP (ENDOMECHANICALS) ×3 IMPLANT
SET CHOLANGIOGRAPH MIX (MISCELLANEOUS) ×3 IMPLANT
SLEEVE XCEL OPT CAN 5 100 (ENDOMECHANICALS) ×3 IMPLANT
SPONGE GAUZE 2X2 STER 10/PKG (GAUZE/BANDAGES/DRESSINGS) ×2
STRIP CLOSURE SKIN 1/2X4 (GAUZE/BANDAGES/DRESSINGS) ×2 IMPLANT
SUT MNCRL AB 4-0 PS2 18 (SUTURE) ×3 IMPLANT
TAPE CLOTH SURG 4X10 WHT LF (GAUZE/BANDAGES/DRESSINGS) ×3 IMPLANT
TOWEL OR 17X26 10 PK STRL BLUE (TOWEL DISPOSABLE) ×3 IMPLANT
TOWEL OR NON WOVEN STRL DISP B (DISPOSABLE) ×3 IMPLANT
TRAY LAPAROSCOPIC (CUSTOM PROCEDURE TRAY) ×3 IMPLANT
TROCAR BLADELESS OPT 5 100 (ENDOMECHANICALS) ×3 IMPLANT
TROCAR XCEL BLUNT TIP 100MML (ENDOMECHANICALS) ×3 IMPLANT
TROCAR XCEL NON-BLD 11X100MML (ENDOMECHANICALS) ×3 IMPLANT
TUBING INSUF HEATED (TUBING) IMPLANT

## 2016-03-22 NOTE — Op Note (Signed)
Procedure Note  Pre-operative Diagnosis:  Chronic cholecystitis, cholelithiasis  Post-operative Diagnosis:  same  Surgeon:  Earnstine Regal, MD, FACS  Assistant:  none   Procedure:  Laparoscopic cholecystectomy with intra-operative cholangiography  Anesthesia:  General  Estimated Blood Loss:  minimal  Drains: none         Specimen: Gallbladder to pathology  Indications:  The patient is a 75 year old female who presents for evaluation of gall stones.  Patient is referred by Dr. Burnard Bunting and Avanell Shackleton for evaluation of symptomatic cholelithiasis and chronic cholecystitis. Patient began having symptoms approximately 6 months ago. She noted pain after meals. It did not really matter what type of food. She denies any nausea or vomiting. She denies fevers or chills. She took an antacid medication which helped somewhat. She has had no prior abdominal surgery. She has had multiple family members who have had cholecystectomy. Patient denies any history of hepatobiliary or pancreatic disease. She denies jaundice or acholic stools.  Procedure Details:  The patient was seen in the pre-op holding area. The risks, benefits, complications, treatment options, and expected outcomes were previously discussed with the patient. The patient agreed with the proposed plan and has signed the informed consent form.  The patient was brought to the Operating Room, identified as Beth [name] and the procedure verified as laparoscopic cholecystectomy with intraoperative cholangiography. A "time out" was completed and the above information confirmed.  The patient was placed in the supine position. Following induction of general anesthesia, the abdomen was prepped and draped in the usual aseptic fashion.  An incision was made in the skin near the umbilicus. The midline fascia was incised and the peritoneal cavity was entered and a Hasson canula was introduced under direct vision.  The Hasson  canula was secured with a 0-Vicryl pursestring suture. Pneumoperitoneum was established with carbon dioxide. Additional trocars were introduced under direct vision along the right costal margin in the midline, mid-clavicular line, and anterior axillary line.   The gallbladder was identified and the fundus grasped and retracted cephalad. Adhesions were taken down bluntly and the electrocautery was utilized as needed, taking care not to injure any adjacent structures. The infundibulum was grasped and retracted laterally, exposing the peritoneum overlying the triangle of Calot. The peritoneum was incised and structures exposed with blunt dissection. The cystic duct was clearly identified, bluntly dissected circumferentially, and clipped at the neck of the gallbladder.  An incision was made in the cystic duct and the cholangiogram catheter introduced. The catheter was secured using an ligaclip.  Real-time cholangiography was performed using C-arm fluoroscopy.  There was rapid filling of a normal caliber common bile duct.  There was reflux of contrast into the left and right hepatic ductal systems.  There was free flow distally into the duodenum without filling defect or obstruction.  The catheter was removed from the peritoneal cavity.  The cystic duct was then ligated with surgical clips and divided. The cystic artery was identified, dissected circumferentially, ligated with ligaclips, and divided.  The gallbladder was dissected away from the liver bed using the electrocautery for hemostasis. The gallbladder was completely removed from the liver and placed into an endocatch bag. The gallbladder was removed in the endocatch bag through the umbilical port site and submitted to pathology for review.  The right upper quadrant was irrigated and the gallbladder bed was inspected. Hemostasis was achieved with the electrocautery.  Pneumoperitoneum was released after viewing removal of the trocars with good  hemostasis noted.  The umbilical wound was irrigated and the fascia was then closed with the pursestring suture.  Local anesthetic was infiltrated at all port sites. The skin incisions were closed with 4-0 Monocril subcuticular sutures and steri-strips and dressings were applied.  Instrument, sponge, and needle counts were correct at the conclusion of the case.  The patient was awakened from anesthesia and brought to the recovery room in stable condition.  The patient tolerated the procedure well.   Earnstine Regal, MD, [facility] Office: [phone]

## 2016-03-22 NOTE — Anesthesia Procedure Notes (Signed)
Procedure Name: Intubation Date/Time: 03/22/2016 7:16 AM Performed by: Dione Booze Pre-anesthesia Checklist: Emergency Drugs available, Patient being monitored, Suction available and Patient identified Patient Re-evaluated:Patient Re-evaluated prior to inductionOxygen Delivery Method: Circle system utilized Preoxygenation: Pre-oxygenation with 100% oxygen Intubation Type: IV induction Ventilation: Mask ventilation with difficulty Laryngoscope Size: Mac and 3 Grade View: Grade II Tube type: Oral Tube size: 7.5 mm Number of attempts: 1 Airway Equipment and Method: Stylet Placement Confirmation: ETT inserted through vocal cords under direct vision,  positive ETCO2 and breath sounds checked- equal and bilateral Secured at: 21 cm Tube secured with: Tape Dental Injury: Teeth and Oropharynx as per pre-operative assessment

## 2016-03-22 NOTE — Care Management Obs Status (Signed)
[facility] NOTIFICATION   Patient Details  Name: Beth [name] MRN: [id number] Date of Birth: September 23, 1940   Medicare Observation Status Notification Given:  Yes    Leeroy Cha, RN 03/22/2016, 2:25 PM

## 2016-03-22 NOTE — Transfer of Care (Signed)
Immediate Anesthesia Transfer of Care Note  Patient: Beth [name]  Procedure(s) Performed: Procedure(s): LAPAROSCOPIC CHOLECYSTECTOMY WITH INTRAOPERATIVE CHOLANGIOGRAM (N/A)  Patient Location: PACU  Anesthesia Type:General  Level of Consciousness: awake, alert , oriented and patient cooperative  Airway & Oxygen Therapy: Patient Spontanous Breathing and Patient connected to face mask oxygen  Post-op Assessment: Report given to RN and Post -op Vital signs reviewed and stable  Post vital signs: Reviewed and stable  Last Vitals:  Vitals:   03/22/16 0544  BP: (!) 154/67  Pulse: 80  Resp: 18  Temp: 36.4 C    Last Pain:  Vitals:   03/22/16 0544  TempSrc: Oral      Patients Stated Pain Goal: 4 ([id number])  Complications: No apparent anesthesia complications

## 2016-03-22 NOTE — Discharge Instructions (Signed)
°  [facility] ° °LAPAROSCOPIC SURGERY:  POST-OP INSTRUCTIONS ° °Always review your discharge instruction sheet given to you by the facility where your surgery was performed. ° °A prescription for pain medication may be given to you upon discharge.  Take your pain medication as prescribed.  If narcotic pain medicine is not needed, then you may take acetaminophen (Tylenol) or ibuprofen (Advil) as needed. ° °Take your usually prescribed medications unless otherwise directed. ° °If you need a refill on your pain medication, please contact your pharmacy.  They will contact our office to request authorization. Prescriptions will not be filled after 5 P.M. or on weekends. ° °You should follow a light diet the first few days after arrival home, such as soup and crackers or toast.  Be sure to include plenty of fluids daily. ° °Most patients will experience some swelling and bruising in the area of the incisions.  Ice packs will help.  Swelling and bruising can take several days to resolve.  ° °It is common to experience some constipation after surgery.  Increasing fluid intake and taking a stool softener (such as Colace) will usually help or prevent this problem from occurring.  A mild laxative (Milk of Magnesia or Miralax) should be taken according to package instructions if there has been no bowel movement after 48 hours. ° °You will have steri-strips and a gauze dressing over your incisions.  You may remove the gauze bandage on the second day after surgery, and you may shower at that time.  Leave your steri-strips (small skin tapes) in place directly over the incision.  These strips should remain on the skin for 5-7 days and then be removed.  You may get them wet in the shower and pat them dry. ° °Any sutures or staples will be removed at the office during your follow-up visit. ° °ACTIVITIES:  You may resume regular (light) daily activities beginning the next day - such as daily self-care, walking,  climbing stairs - gradually increasing activities as tolerated.  You may have sexual intercourse when it is comfortable.  Refrain from any heavy lifting or straining until approved by your doctor. ° °You may drive when you are no longer taking prescription pain medication, you can comfortably wear a seatbelt, and you can safely maneuver your car and apply brakes. ° °You should see your doctor in the office for a follow-up appointment approximately 2-3 weeks after your surgery.  Make sure that you call for this appointment within a day or two after you arrive home to insure a convenient appointment time. ° °WHEN TO CALL YOUR DOCTOR: °1. Fever over 101.0 °2. Inability to urinate °3. Continued bleeding from incision °4. Increased pain, redness, or drainage from the incision °5. Increasing abdominal pain ° °The clinic staff is available to answer your questions during regular business hours.  Please don’t hesitate to call and ask to speak to one of the nurses for clinical concerns.  If you have a medical emergency, go to the nearest emergency room or call 911.  A surgeon from [facility] is always on call for the hospital. ° °Cj Beecher M. Kohler Pellerito, MD, FACS °[facility] °Office: [phone] °Toll Free:  [phone] °FAX [phone] ° °Website: [email or web address] °

## 2016-03-22 NOTE — Anesthesia Postprocedure Evaluation (Signed)
Anesthesia Post Note  Patient: Beth [name]  Procedure(s) Performed: Procedure(s) (LRB): LAPAROSCOPIC CHOLECYSTECTOMY WITH INTRAOPERATIVE CHOLANGIOGRAM (N/A)  Patient location during evaluation: PACU Anesthesia Type: General Level of consciousness: sedated and patient cooperative Pain management: pain level controlled Vital Signs Assessment: post-procedure vital signs reviewed and stable Respiratory status: spontaneous breathing Cardiovascular status: stable Anesthetic complications: no    Last Vitals:  Vitals:   03/22/16 0925 03/22/16 1110  BP: (!) 145/68 (!) 157/66  Pulse: 70 71  Resp: 15 18  Temp: 36.6 C 36.7 C    Last Pain:  Vitals:   03/22/16 1110  TempSrc: Oral  PainSc:                  Nolon Nations

## 2016-03-22 NOTE — Progress Notes (Signed)
Patient up 1 assist to bathroom to void. O2 saturations after ambulating with no supplemental O2 was 98%. Will leave oxygen off at this time. Patient tolerated glass of water, upgraded diet to full liquid. Bed alarm set, sister at bedside.

## 2016-03-22 NOTE — Interval H&P Note (Signed)
History and Physical Interval Note:  03/22/2016 6:40 AM  Beth [name]  has presented today for surgery, with the diagnosis of Chronic cholecystitis, cholelithiasis.  The various methods of treatment have been discussed with the patient and family. After consideration of risks, benefits and other options for treatment, the patient has consented to    Procedure(s): LAPAROSCOPIC CHOLECYSTECTOMY WITH INTRAOPERATIVE CHOLANGIOGRAM (N/A) as a surgical intervention .    The patient's history has been reviewed, patient examined, no change in status, stable for surgery.  I have reviewed the patient's chart and labs.  Questions were answered to the patient's satisfaction.    Earnstine Regal, MD, [facility] Office: [place]

## 2016-03-23 DIAGNOSIS — K8010 Calculus of gallbladder with chronic cholecystitis without obstruction: Secondary | ICD-10-CM | POA: Diagnosis not present

## 2016-03-23 DIAGNOSIS — I10 Essential (primary) hypertension: Secondary | ICD-10-CM | POA: Diagnosis not present

## 2016-03-23 DIAGNOSIS — E7800 Pure hypercholesterolemia, unspecified: Secondary | ICD-10-CM | POA: Diagnosis not present

## 2016-03-23 MED ORDER — HYDROCODONE-ACETAMINOPHEN 5-325 MG PO TABS: 1.0000 | ORAL_TABLET | ORAL | 0 refills | Status: DC | PRN

## 2016-03-23 NOTE — Progress Notes (Signed)
Pt discharged from the unit via wheelchair. Discharge instructions were reviewed with the pt. Prescriptions provided to the pt. No questions or concerns at this time.  Britton Perkinson W Adreona Brand, RN

## 2016-03-23 NOTE — Discharge Summary (Signed)
Physician Discharge Summary [facility]  Patient ID: Beth [name] MRN: [id number] DOB/AGE: 11-15-1940 75 y.o.  Admit date: 03/22/2016 Discharge date: 03/23/2016  Admission Diagnoses:  Chronic cholecystitis, cholelitihiasis  Discharge Diagnoses:  Principal Problem:   Cholelithiasis with chronic cholecystitis   Discharged Condition: good  Hospital Course: Patient was admitted for observation following gallbladder surgery.  Post op course was uncomplicated.  Pain was well controlled.  Tolerated diet.  Patient was prepared for discharge home on POD#1.  Consults: None  Treatments: surgery: lap chole with IOC  Discharge Exam: Blood pressure (!) 117/54, pulse 61, temperature 98.5 F (36.9 C), temperature source Oral, resp. rate 18, height 5\' 3"  (1.6 [name]), weight 71.5 kg (157 lb 10.1 oz), SpO2 98 %. HEENT - clear Neck - soft Chest - clear bilaterally Cor - RRR Abd - soft without distension; dressings dry and intact; minimal tenderness  Disposition: Home  Discharge Instructions    Diet - low sodium heart healthy    Complete by:  As directed    Discharge instructions    Complete by:  As directed    [facility]  LAPAROSCOPIC SURGERY:  POST-OP INSTRUCTIONS  Always review your discharge instruction sheet given to you by the facility where your surgery was performed.  A prescription for pain medication may be given to you upon discharge.  Take your pain medication as prescribed.  If narcotic pain medicine is not needed, then you may take acetaminophen (Tylenol) or ibuprofen (Advil) as needed.  Take your usually prescribed medications unless otherwise directed.  If you need a refill on your pain medication, please contact your pharmacy.  They will contact our office to request authorization. Prescriptions will not be filled after 5 P.[name] or on weekends.  You should follow a light diet the first few days after arrival home, such as soup and  crackers or toast.  Be sure to include plenty of fluids daily.  Most patients will experience some swelling and bruising in the area of the incisions.  Ice packs will help.  Swelling and bruising can take several days to resolve.   It is common to experience some constipation after surgery.  Increasing fluid intake and taking a stool softener (such as Colace) will usually help or prevent this problem from occurring.  A mild laxative (Milk of Magnesia or Miralax) should be taken according to package instructions if there has been no bowel movement after 48 hours.  You will have steri-strips and a gauze dressing over your incisions.  You may remove the gauze bandage on the second day after surgery, and you may shower at that time.  Leave your steri-strips (small skin tapes) in place directly over the incision.  These strips should remain on the skin for 5-7 days and then be removed.  You may get them wet in the shower and pat them dry.  Any sutures or staples will be removed at the office during your follow-up visit.  ACTIVITIES:  You may resume regular (light) daily activities beginning the next day - such as daily self-care, walking, climbing stairs - gradually increasing activities as tolerated.  You may have sexual intercourse when it is comfortable.  Refrain from any heavy lifting or straining until approved by your doctor.  You may drive when you are no longer taking prescription pain medication, you can comfortably wear a seatbelt, and you can safely maneuver your car and apply brakes.  You should see your doctor in the office for  a follow-up appointment approximately 2-3 weeks after your surgery.  Make sure that you call for this appointment within a day or two after you arrive home to insure a convenient appointment time.  WHEN TO CALL YOUR DOCTOR: Fever over 101.0 Inability to urinate Continued bleeding from incision Increased pain, redness, or drainage from the incision Increasing  abdominal pain  The clinic staff is available to answer your questions during regular business hours.  Please don't hesitate to call and ask to speak to one of the nurses for clinical concerns.  If you have a medical emergency, go to the nearest emergency room or call 911.  A surgeon from [facility] is always on call for the hospital.  Earnstine Regal, MD, [facility] Office: [place] Free:  [place] [phone]  Website: [email or web address]   Increase activity slowly    Complete by:  As directed    Remove dressing in 24 hours    Complete by:  As directed        Medication List    TAKE these medications   BIOTIN 5000 5 MG Caps Generic drug:  Biotin Take 5 mg by mouth daily.   cholecalciferol 1000 units tablet Commonly known as:  VITAMIN D Take 1,000 Units by mouth daily.   GLUCOSAMINE 1500 COMPLEX PO Take 3,000 mg by mouth daily.   HYDROcodone-acetaminophen 5-325 MG tablet Commonly known as:  NORCO/VICODIN Take 1-2 tablets by mouth every 4 (four) hours as needed for moderate pain.   HYDROcodone-acetaminophen 5-325 MG tablet Commonly known as:  NORCO/VICODIN Take 1-2 tablets by mouth every 4 (four) hours as needed for moderate pain.   lisinopril 20 MG tablet Commonly known as:  PRINIVIL,ZESTRIL Take 20 mg by mouth every morning.   OCUVITE EXTRA PO Take 1 tablet by mouth daily.   omeprazole 20 MG capsule Commonly known as:  PRILOSEC Take 20 mg by mouth daily before breakfast.   Potassium 99 MG Tabs Take 99 mg by mouth daily.   Red Yeast Rice Extract 600 MG Caps Take 600 mg by mouth daily.   vitamin B-12 1000 MCG tablet Commonly known as:  CYANOCOBALAMIN Take 1,000 mcg by mouth daily.      Follow-up Information    Beth Usery M, MD. Schedule an appointment as soon as possible for a visit in 3 week(s).   Specialty:  General Surgery Why:  For wound re-check Contact information: [place] [address] [phone]           Earnstine Regal, MD, [facility] Office: [phone]   Signed: Earnstine Regal 03/23/2016, 8:19 AM

## 2016-03-23 NOTE — Progress Notes (Signed)
Date: March 23, 2016 Discharge orders checked for needs. No needs present at time of discharge. Velva Harman, RN, BSN, [place]   [phone]

## 2016-03-23 NOTE — Progress Notes (Signed)
Patient ambulated approx 90 feet with RN at her side. Patient tolerated well. Gait steady, patient states she feels stronger and pain is minimal. Will continue to monitor.

## 2016-03-30 ENCOUNTER — Telehealth: Payer: Self-pay | Admitting: *Deleted

## 2016-03-30 NOTE — Telephone Encounter (Signed)
Dr. [name],   This patient is 75 y/o and is scheduled for a PV 04/06/16 and a direct colonoscopy 04/20/16. It does not appear that she has ever had a colonoscopy. She had a laparoscopic cholecystectomy 03/22/16. Is it okay to proceed with PV 04/06/16 and then send a note to you for review as per our policy with patients who are 75 y/o and never had colon or would you prefer for her to have an office visit with you or an extender? Also, is she appropriate for colonoscopy 04/20/16 since she just had cholecystectomy 03/22/16?  Please advise. Thank you, Olivia Mackie

## 2016-03-30 NOTE — Telephone Encounter (Signed)
She is appropriate in terms of age. My cut off is 21 assuming they are in good health and the examination is routine screening (no other issues such as symptoms, abnormal labs, etc.). These caveats and be assessed by the previous nurse. In terms of recent surgery to be fine assuming that she has had no postoperative issues. Thanks

## 2016-04-06 ENCOUNTER — Ambulatory Visit (AMBULATORY_SURGERY_CENTER): Payer: Self-pay | Admitting: *Deleted

## 2016-04-06 VITALS — Ht 63.0 in | Wt 154.0 lb

## 2016-04-06 DIAGNOSIS — Z1211 Encounter for screening for malignant neoplasm of colon: Secondary | ICD-10-CM

## 2016-04-06 MED ORDER — NA SULFATE-K SULFATE-MG SULF 17.5-3.13-1.6 GM/177ML PO SOLN: ORAL | 0 refills | Status: DC

## 2016-04-06 NOTE — Progress Notes (Signed)
Patient denies any allergies to eggs or soy. Patient denies any problems with anesthesia/sedation. Patient denies any oxygen use at home and does not take any diet/weight loss medications.  

## 2016-04-08 ENCOUNTER — Telehealth: Payer: Self-pay | Admitting: Internal Medicine

## 2016-04-08 DIAGNOSIS — K8010 Calculus of gallbladder with chronic cholecystitis without obstruction: Secondary | ICD-10-CM | POA: Diagnosis not present

## 2016-04-08 NOTE — Telephone Encounter (Signed)
Spoke with pt. Informed that there was a rebate card (pay no more than $50) sent to [place] on [address]. Pt thankful.

## 2016-04-20 ENCOUNTER — Ambulatory Visit (AMBULATORY_SURGERY_CENTER): Payer: Medicare Other | Admitting: Internal Medicine

## 2016-04-20 ENCOUNTER — Encounter: Payer: Self-pay | Admitting: Internal Medicine

## 2016-04-20 VITALS — BP 109/64 | HR 58 | Temp 96.6°F | Resp 14 | Ht 63.0 in | Wt 154.0 lb

## 2016-04-20 DIAGNOSIS — Z1212 Encounter for screening for malignant neoplasm of rectum: Secondary | ICD-10-CM

## 2016-04-20 DIAGNOSIS — Z1211 Encounter for screening for malignant neoplasm of colon: Secondary | ICD-10-CM

## 2016-04-20 DIAGNOSIS — I10 Essential (primary) hypertension: Secondary | ICD-10-CM | POA: Diagnosis not present

## 2016-04-20 MED ORDER — SODIUM CHLORIDE 0.9 % IV SOLN: 500.0000 mL | INTRAVENOUS | Status: DC

## 2016-04-20 NOTE — Op Note (Signed)
[facility] Patient Name: Beth [name] Procedure Date: 04/20/2016 1:11 PM MRN: [id number] Endoscopist: Docia Chuck. [name] , MD Age: 75 Referring MD:  Date of Birth: 01/02/1941 Gender: Female Account #: [id number] Procedure:                Colonoscopy Indications:              Screening for colorectal malignant neoplasm.                            Reports negative exam 13 years ago (no details) Medicines:                Monitored Anesthesia Care Procedure:                Pre-Anesthesia Assessment:                           - Prior to the procedure, a History and Physical                            was performed, and patient medications and                            allergies were reviewed. The patient's tolerance of                            previous anesthesia was also reviewed. The risks                            and benefits of the procedure and the sedation                            options and risks were discussed with the patient.                            All questions were answered, and informed consent                            was obtained. Prior Anticoagulants: The patient has                            taken no previous anticoagulant or antiplatelet                            agents. ASA Grade Assessment: II - A patient with                            mild systemic disease. After reviewing the risks                            and benefits, the patient was deemed in                            satisfactory condition to undergo the procedure.  After obtaining informed consent, the colonoscope                            was passed under direct vision. Throughout the                            procedure, the patient's blood pressure, pulse, and                            oxygen saturations were monitored continuously. The                            Model CF-HQ190L [id number]) scope was introduced                            through the anus  and advanced to the the cecum,                            identified by appendiceal orifice and ileocecal                            valve. The ileocecal valve, appendiceal orifice,                            and rectum were photographed. The quality of the                            bowel preparation was excellent. The colonoscopy                            was performed without difficulty. The patient                            tolerated the procedure well. The bowel preparation                            used was SUPREP. Scope In: 1:20:51 PM Scope Out: 1:39:10 PM Scope Withdrawal Time: 0 hours 12 minutes 36 seconds  Total Procedure Duration: 0 hours 18 minutes 19 seconds  Findings:                 Multiple medium-mouthed diverticula were found in                            the proximal transverse colon, mid transverse colon                            and right colon.                           Internal hemorrhoids were found during retroflexion.                           The exam was otherwise without abnormality on  direct and retroflexion views. Complications:            No immediate complications. Estimated blood loss:                            None. Estimated Blood Loss:     Estimated blood loss: none. Impression:               - Diverticulosis in the proximal transverse colon,                            in the mid transverse colon and in the right colon.                           - Internal hemorrhoids.                           - The examination was otherwise normal on direct                            and retroflexion views.                           - No specimens collected. Recommendation:           - Repeat colonoscopy is not recommended for                            screening.                           - Patient has a contact number available for                            emergencies. The signs and symptoms of potential                             delayed complications were discussed with the                            patient. Return to normal activities tomorrow.                            Written discharge instructions were provided to the                            patient.                           - Resume previous diet.                           - Continue present medications. Docia Chuck. [name], MD 04/20/2016 1:43:05 PM This report has been signed electronically.

## 2016-04-20 NOTE — Patient Instructions (Signed)
YOU HAD AN ENDOSCOPIC PROCEDURE TODAY AT [facility]:   Refer to the procedure report that was given to you for any specific questions about what was found during the examination.  If the procedure report does not answer your questions, please call your gastroenterologist to clarify.  If you requested that your care partner not be given the details of your procedure findings, then the procedure report has been included in a sealed envelope for you to review at your convenience later.  YOU SHOULD EXPECT: Some feelings of bloating in the abdomen. Passage of more gas than usual.  Walking can help get rid of the air that was put into your GI tract during the procedure and reduce the bloating. If you had a lower endoscopy (such as a colonoscopy or flexible sigmoidoscopy) you may notice spotting of blood in your stool or on the toilet paper. If you underwent a bowel prep for your procedure, you may not have a normal bowel movement for a few days.  Please Note:  You might notice some irritation and congestion in your nose or some drainage.  This is from the oxygen used during your procedure.  There is no need for concern and it should clear up in a day or so.  SYMPTOMS TO REPORT IMMEDIATELY:   Following lower endoscopy (colonoscopy or flexible sigmoidoscopy):  Excessive amounts of blood in the stool  Significant tenderness or worsening of abdominal pains  Swelling of the abdomen that is new, acute  Fever of 100F or higher  For urgent or emergent issues, a gastroenterologist can be reached at any hour by calling [phone].   DIET:  We do recommend a small meal at first, but then you may proceed to your regular diet.  Drink plenty of fluids but you should avoid alcoholic beverages for 24 hours.  ACTIVITY:  You should plan to take it easy for the rest of today and you should NOT DRIVE or use heavy machinery until tomorrow (because of the sedation medicines used during the test).     FOLLOW UP: Our staff will call the number listed on your records the next business day following your procedure to check on you and address any questions or concerns that you may have regarding the information given to you following your procedure. If we do not reach you, we will leave a message.  However, if you are feeling well and you are not experiencing any problems, there is no need to return our call.  We will assume that you have returned to your regular daily activities without incident.  If any biopsies were taken you will be contacted by phone or by letter within the next 1-3 weeks.  Please call us at [phone] if you have not heard about the biopsies in 3 weeks.    SIGNATURES/CONFIDENTIALITY: You and/or your care partner have signed paperwork which will be entered into your electronic medical record.  These signatures attest to the fact that that the information above on your After Visit Summary has been reviewed and is understood.  Full responsibility of the confidentiality of this discharge information lies with you and/or your care-partner.  Diverticulosis, high fiber diet and hemorrhoid information given,.

## 2016-04-20 NOTE — Progress Notes (Signed)
To PACU, vss patent aw report to rn 

## 2016-04-21 ENCOUNTER — Telehealth: Payer: Self-pay

## 2016-04-21 NOTE — Telephone Encounter (Signed)
  Follow up Call-  Call back number 04/20/2016  Post procedure Call Back phone  # [phone]  Permission to leave phone message Yes  Some recent data might be hidden     Patient questions:  Do you have a fever, pain , or abdominal swelling? No. Pain Score  0 *  Have you tolerated food without any problems? Yes.    Have you been able to return to your normal activities? Yes.    Do you have any questions about your discharge instructions: Diet   No. Medications  No. Follow up visit  No.  Do you have questions or concerns about your Care? No.  Actions: * If pain score is 4 or above: No action needed, pain <4.

## 2016-09-07 ENCOUNTER — Other Ambulatory Visit: Payer: Self-pay | Admitting: Internal Medicine

## 2016-09-07 DIAGNOSIS — Z1231 Encounter for screening mammogram for malignant neoplasm of breast: Secondary | ICD-10-CM

## 2016-09-26 ENCOUNTER — Ambulatory Visit
Admission: RE | Admit: 2016-09-26 | Discharge: 2016-09-26 | Disposition: A | Discharge status: Home / Self Care | Payer: Medicare Other | Source: Ambulatory Visit | Attending: Internal Medicine | Admitting: Internal Medicine

## 2016-09-26 DIAGNOSIS — Z1231 Encounter for screening mammogram for malignant neoplasm of breast: Secondary | ICD-10-CM

## 2016-11-10 DIAGNOSIS — K8020 Calculus of gallbladder without cholecystitis without obstruction: Secondary | ICD-10-CM | POA: Diagnosis not present

## 2016-11-10 DIAGNOSIS — R7301 Impaired fasting glucose: Secondary | ICD-10-CM | POA: Diagnosis not present

## 2016-11-10 DIAGNOSIS — M1990 Unspecified osteoarthritis, unspecified site: Secondary | ICD-10-CM | POA: Diagnosis not present

## 2016-11-10 DIAGNOSIS — Z6828 Body mass index (BMI) 28.0-28.9, adult: Secondary | ICD-10-CM | POA: Diagnosis not present

## 2016-11-10 DIAGNOSIS — E559 Vitamin D deficiency, unspecified: Secondary | ICD-10-CM | POA: Diagnosis not present

## 2016-11-10 DIAGNOSIS — G542 Cervical root disorders, not elsewhere classified: Secondary | ICD-10-CM | POA: Diagnosis not present

## 2016-11-10 DIAGNOSIS — I10 Essential (primary) hypertension: Secondary | ICD-10-CM | POA: Diagnosis not present

## 2016-11-10 DIAGNOSIS — E784 Other hyperlipidemia: Secondary | ICD-10-CM | POA: Diagnosis not present

## 2016-11-10 DIAGNOSIS — L659 Nonscarring hair loss, unspecified: Secondary | ICD-10-CM | POA: Diagnosis not present

## 2016-11-10 DIAGNOSIS — Z1389 Encounter for screening for other disorder: Secondary | ICD-10-CM | POA: Diagnosis not present

## 2016-12-05 DIAGNOSIS — H5203 Hypermetropia, bilateral: Secondary | ICD-10-CM | POA: Diagnosis not present

## 2016-12-05 DIAGNOSIS — H2513 Age-related nuclear cataract, bilateral: Secondary | ICD-10-CM | POA: Diagnosis not present

## 2016-12-05 DIAGNOSIS — H353111 Nonexudative age-related macular degeneration, right eye, early dry stage: Secondary | ICD-10-CM | POA: Diagnosis not present

## 2017-03-09 DIAGNOSIS — Z23 Encounter for immunization: Secondary | ICD-10-CM | POA: Diagnosis not present

## 2017-04-24 DIAGNOSIS — I10 Essential (primary) hypertension: Secondary | ICD-10-CM | POA: Diagnosis not present

## 2017-04-24 DIAGNOSIS — R42 Dizziness and giddiness: Secondary | ICD-10-CM | POA: Diagnosis not present

## 2017-04-24 DIAGNOSIS — Z6829 Body mass index (BMI) 29.0-29.9, adult: Secondary | ICD-10-CM | POA: Diagnosis not present

## 2017-06-13 DIAGNOSIS — C50919 Malignant neoplasm of unspecified site of unspecified female breast: Secondary | ICD-10-CM

## 2017-06-13 HISTORY — DX: Malignant neoplasm of unspecified site of unspecified female breast: C50.919

## 2017-07-03 DIAGNOSIS — M179 Osteoarthritis of knee, unspecified: Secondary | ICD-10-CM | POA: Diagnosis not present

## 2017-07-03 DIAGNOSIS — N189 Chronic kidney disease, unspecified: Secondary | ICD-10-CM | POA: Diagnosis not present

## 2017-07-03 DIAGNOSIS — M7050 Other bursitis of knee, unspecified knee: Secondary | ICD-10-CM | POA: Diagnosis not present

## 2017-07-03 DIAGNOSIS — M1990 Unspecified osteoarthritis, unspecified site: Secondary | ICD-10-CM | POA: Diagnosis not present

## 2017-07-03 DIAGNOSIS — M25561 Pain in right knee: Secondary | ICD-10-CM | POA: Diagnosis not present

## 2017-07-03 DIAGNOSIS — Z6829 Body mass index (BMI) 29.0-29.9, adult: Secondary | ICD-10-CM | POA: Diagnosis not present

## 2017-07-12 ENCOUNTER — Ambulatory Visit (INDEPENDENT_AMBULATORY_CARE_PROVIDER_SITE_OTHER): Payer: Self-pay | Admitting: Orthopaedic Surgery

## 2017-07-17 ENCOUNTER — Encounter (INDEPENDENT_AMBULATORY_CARE_PROVIDER_SITE_OTHER): Payer: Self-pay | Admitting: Orthopaedic Surgery

## 2017-07-17 ENCOUNTER — Ambulatory Visit (INDEPENDENT_AMBULATORY_CARE_PROVIDER_SITE_OTHER): Payer: Medicare Other

## 2017-07-17 ENCOUNTER — Ambulatory Visit (INDEPENDENT_AMBULATORY_CARE_PROVIDER_SITE_OTHER): Payer: Medicare Other | Admitting: Orthopaedic Surgery

## 2017-07-17 VITALS — Ht 63.0 in | Wt 150.0 lb

## 2017-07-17 DIAGNOSIS — G8929 Other chronic pain: Secondary | ICD-10-CM

## 2017-07-17 DIAGNOSIS — M25561 Pain in right knee: Secondary | ICD-10-CM

## 2017-07-17 DIAGNOSIS — M1711 Unilateral primary osteoarthritis, right knee: Secondary | ICD-10-CM | POA: Diagnosis not present

## 2017-07-17 MED ORDER — METHYLPREDNISOLONE ACETATE 40 MG/ML IJ SUSP
40.0000 mg | INTRAMUSCULAR | Status: AC | PRN
  Administered 2017-07-17: 40 mg via INTRA_ARTICULAR

## 2017-07-17 MED ORDER — LIDOCAINE HCL 1 % IJ SOLN
3.0000 mL | INTRAMUSCULAR | Status: AC | PRN
  Administered 2017-07-17: 3 mL

## 2017-07-17 NOTE — Progress Notes (Signed)
Office Visit Note   Patient: Beth [name]           Date of Birth: [birth year]           MRN: [id number] Visit Date: 07/17/2017              Requested by: Pa, [facility] [address] [place], [place] [address] PCP: Burnard Bunting, MD   Assessment & Plan: Visit Diagnoses:  1. Chronic pain of right knee   2. Unilateral primary osteoarthritis, right knee     Plan: I do feel that she would benefit from a steroid injection in that right knee and explained the rationale the risk and benefits of this to her and she agreed with this.  She is a perfect candidate to try hyaluronic acid as well and to work on quad strengthening exercises.  She tolerated steroid injection well.  We will see her back in 4 weeks to place hyaluronic acid into her right knee.  All questions concerns were answered and addressed.  Follow-Up Instructions: Return in about 4 weeks (around 08/14/2017).   Orders:  Orders Placed This Encounter  Procedures  . Large Joint Inj  . XR KNEE 3 VIEW RIGHT   No orders of the defined types were placed in this encounter.     Procedures: Large Joint Inj: R knee on 07/17/2017 3:29 PM Indications: diagnostic evaluation and pain Details: 22 G 1.5 in needle, superolateral approach  Arthrogram: No  Medications: 3 mL lidocaine 1 %; 40 mg methylPREDNISolone acetate 40 MG/ML Outcome: tolerated well, no immediate complications Procedure, treatment alternatives, risks and benefits explained, specific risks discussed. Consent was given by the patient. Immediately prior to procedure a time out was called to verify the correct patient, procedure, equipment, support staff and site/side marked as required. Patient was prepped and draped in the usual sterile fashion.       Clinical Data: No additional findings.   Subjective: Chief Complaint  Patient presents with  . Right Knee - Pain  Patient is a very pleasant 77 year old who is an avid bowler.  She is been having  right knee pain is been slowly worsening over the last several months to a year.  It does wake her up at night on occasion when she has pain in her right knee.  She points the medial joint line as a source of her pain.  She says she has problems going up and down stairs as well as walking on uneven ground.  She does get some swelling occasionally in that knee as well.  She denies any other injuries or other issues as it relates to her chief complaint of right knee pain.  HPI  Review of Systems She currently denies any headache, chest pain, shortness of breath, fever, chills, nausea, vomiting.  Objective: Vital Signs: Ht 5\' 3"  (1.6 m)   Wt 150 lb (68 kg)   BMI 26.57 kg/m   Physical Exam She is alert and oriented x3 in no acute distress Ortho Exam Examination of her right knee shows significant medial joint line tenderness.  Is mild varus malalignment but excellent and full range of motion.  There is no significant patellofemoral crepitation. Specialty Comments:  No specialty comments available.  Imaging: Xr Knee 3 View Right  Result Date: 07/17/2017 3 views of the right knee show severe tricompartmental arthritic changes.  There is complete loss of medial joint space with sclerotic changes and periarticular osteophytes in all 3 compartments.  The joint space is  significantly narrowed that he can be called "bone on bone".    PMFS History: Patient Active Problem List   Diagnosis Date Noted  . Unilateral primary osteoarthritis, right knee 07/17/2017  . Chronic pain of right knee 07/17/2017  . Cholelithiasis with chronic cholecystitis 03/20/2016   Past Medical History:  Diagnosis Date  . Hypertension     Family History  Problem Relation Age of Onset  . Colon cancer Maternal Aunt     Past Surgical History:  Procedure Laterality Date  . BREAST EXCISIONAL BIOPSY Bilateral 1960  . BREAST SURGERY Bilateral    breast lumpectomy-benign 60's  . BUNIONECTOMY Bilateral    70's  .  CHOLECYSTECTOMY N/A 03/22/2016   Procedure: LAPAROSCOPIC CHOLECYSTECTOMY WITH INTRAOPERATIVE CHOLANGIOGRAM;  Surgeon: Armandina Gemma, MD;  Location: [facility];  Service: General;  Laterality: N/A;  . CYST EXCISION Right    thumb  . TONSILLECTOMY     Social History   Occupational History  . Not on file  Tobacco Use  . Smoking status: Never Smoker  . Smokeless tobacco: Never Used  Substance and Sexual Activity  . Alcohol use: Yes    Comment: social  . Drug use: No  . Sexual activity: Not Currently

## 2017-07-18 ENCOUNTER — Telehealth (INDEPENDENT_AMBULATORY_CARE_PROVIDER_SITE_OTHER): Payer: Self-pay

## 2017-07-18 NOTE — Telephone Encounter (Signed)
Talked with patient and advised her that Monovisc Injection would be [employer], with primary paying 80%,secondary paying 20%.  Stated that she would like to have Monovisc injection in right knee on 08/14/17 office visit.

## 2017-08-14 ENCOUNTER — Ambulatory Visit (INDEPENDENT_AMBULATORY_CARE_PROVIDER_SITE_OTHER): Payer: Medicare Other | Admitting: Orthopaedic Surgery

## 2017-08-14 ENCOUNTER — Encounter (INDEPENDENT_AMBULATORY_CARE_PROVIDER_SITE_OTHER): Payer: Self-pay | Admitting: Orthopaedic Surgery

## 2017-08-14 DIAGNOSIS — M1711 Unilateral primary osteoarthritis, right knee: Secondary | ICD-10-CM

## 2017-08-14 MED ORDER — HYALURONAN 88 MG/4ML IX SOSY
88.0000 mg | PREFILLED_SYRINGE | INTRA_ARTICULAR | Status: AC | PRN
  Administered 2017-08-14: 88 mg via INTRA_ARTICULAR

## 2017-08-14 NOTE — Progress Notes (Signed)
   Procedure Note  Patient: Beth [name]             Date of Birth: July 28, 1940           MRN: [id number]             Visit Date: 08/14/2017  Procedures: Visit Diagnoses: Unilateral primary osteoarthritis, right knee  Large Joint Inj: R knee on 08/14/2017 2:00 PM Indications: pain and diagnostic evaluation Details: 22 G 1.5 in needle, superolateral approach  Arthrogram: No  Medications: 88 mg Hyaluronan 88 MG/4ML Outcome: tolerated well, no immediate complications Procedure, treatment alternatives, risks and benefits explained, specific risks discussed. Consent was given by the patient. Immediately prior to procedure a time out was called to verify the correct patient, procedure, equipment, support staff and site/side marked as required. Patient was prepped and draped in the usual sterile fashion.      The patient is here for scheduled hyaluronic acid injection with Monovisc in her right knee to treat moderate osteoarthritis pain.  This is been well documented with x-rays and her clinical exam.  She is already tried activity modification as well as quad strengthening exercises and anti-inflammatories as well as steroid injection.  On exam her knee does have patellofemoral crepitation on the right side with medial joint line tenderness but good range of motion overall.  She does need to work on Forensic scientist.  He had a long and thorough discussion about hyaluronic acid while we are placing Monovisc in her knee today to treat moderate osteoarthritis pain.  She understands this fully.  She tolerated the injection well.  She will follow-up as needed knowing that he does have a repeat steroid injection at 3 months if needed.

## 2017-08-17 ENCOUNTER — Other Ambulatory Visit: Payer: Self-pay | Admitting: Internal Medicine

## 2017-08-17 DIAGNOSIS — Z1231 Encounter for screening mammogram for malignant neoplasm of breast: Secondary | ICD-10-CM

## 2017-09-11 DIAGNOSIS — C50912 Malignant neoplasm of unspecified site of left female breast: Secondary | ICD-10-CM

## 2017-09-11 HISTORY — DX: Malignant neoplasm of unspecified site of left female breast: C50.912

## 2017-09-28 ENCOUNTER — Ambulatory Visit
Admission: RE | Admit: 2017-09-28 | Discharge: 2017-09-28 | Disposition: A | Discharge status: Home / Self Care | Payer: Medicare Other | Source: Ambulatory Visit | Attending: Internal Medicine | Admitting: Internal Medicine

## 2017-09-28 DIAGNOSIS — Z1231 Encounter for screening mammogram for malignant neoplasm of breast: Secondary | ICD-10-CM | POA: Diagnosis not present

## 2017-10-02 ENCOUNTER — Other Ambulatory Visit: Payer: Self-pay | Admitting: Internal Medicine

## 2017-10-02 DIAGNOSIS — R928 Other abnormal and inconclusive findings on diagnostic imaging of breast: Secondary | ICD-10-CM

## 2017-10-06 ENCOUNTER — Ambulatory Visit
Admission: RE | Admit: 2017-10-06 | Discharge: 2017-10-06 | Disposition: A | Discharge status: Home / Self Care | Payer: Medicare Other | Source: Ambulatory Visit | Attending: Internal Medicine | Admitting: Internal Medicine

## 2017-10-06 ENCOUNTER — Other Ambulatory Visit: Payer: Self-pay | Admitting: Internal Medicine

## 2017-10-06 DIAGNOSIS — R922 Inconclusive mammogram: Secondary | ICD-10-CM | POA: Diagnosis not present

## 2017-10-06 DIAGNOSIS — N6320 Unspecified lump in the left breast, unspecified quadrant: Secondary | ICD-10-CM

## 2017-10-06 DIAGNOSIS — R928 Other abnormal and inconclusive findings on diagnostic imaging of breast: Secondary | ICD-10-CM

## 2017-10-06 DIAGNOSIS — N6489 Other specified disorders of breast: Secondary | ICD-10-CM | POA: Diagnosis not present

## 2017-10-09 ENCOUNTER — Ambulatory Visit
Admission: RE | Admit: 2017-10-09 | Discharge: 2017-10-09 | Disposition: A | Discharge status: Home / Self Care | Payer: Medicare Other | Source: Ambulatory Visit | Attending: Internal Medicine | Admitting: Internal Medicine

## 2017-10-09 ENCOUNTER — Other Ambulatory Visit: Payer: Self-pay | Admitting: Internal Medicine

## 2017-10-09 DIAGNOSIS — N6320 Unspecified lump in the left breast, unspecified quadrant: Secondary | ICD-10-CM

## 2017-10-09 DIAGNOSIS — N6321 Unspecified lump in the left breast, upper outer quadrant: Secondary | ICD-10-CM | POA: Diagnosis not present

## 2017-10-09 DIAGNOSIS — C50812 Malignant neoplasm of overlapping sites of left female breast: Secondary | ICD-10-CM | POA: Diagnosis not present

## 2017-10-09 DIAGNOSIS — N6323 Unspecified lump in the left breast, lower outer quadrant: Secondary | ICD-10-CM | POA: Diagnosis not present

## 2017-10-11 ENCOUNTER — Telehealth: Payer: Self-pay | Admitting: Hematology and Oncology

## 2017-10-11 NOTE — Telephone Encounter (Signed)
Spoke with patient to confirm afternoon [facility] appointment for 5/15, packet will be mailed to patient

## 2017-10-13 ENCOUNTER — Encounter: Payer: Self-pay | Admitting: *Deleted

## 2017-10-13 DIAGNOSIS — C50412 Malignant neoplasm of upper-outer quadrant of left female breast: Secondary | ICD-10-CM

## 2017-10-13 DIAGNOSIS — Z170 Estrogen receptor positive status [ER+]: Principal | ICD-10-CM | POA: Insufficient documentation

## 2017-10-22 ENCOUNTER — Encounter: Payer: Self-pay | Admitting: General Surgery

## 2017-10-25 ENCOUNTER — Inpatient Hospital Stay: Payer: Medicare Other

## 2017-10-25 ENCOUNTER — Encounter: Payer: Self-pay | Admitting: Physical Therapy

## 2017-10-25 ENCOUNTER — Encounter: Payer: Self-pay | Admitting: Hematology and Oncology

## 2017-10-25 ENCOUNTER — Ambulatory Visit
Admission: RE | Admit: 2017-10-25 | Discharge: 2017-10-25 | Disposition: A | Discharge status: Home / Self Care | Payer: Medicare Other | Source: Ambulatory Visit | Attending: Radiation Oncology | Admitting: Radiation Oncology

## 2017-10-25 ENCOUNTER — Inpatient Hospital Stay: Payer: Medicare Other | Attending: Hematology and Oncology | Admitting: Hematology and Oncology

## 2017-10-25 ENCOUNTER — Ambulatory Visit: Payer: Medicare Other | Attending: General Surgery | Admitting: Physical Therapy

## 2017-10-25 ENCOUNTER — Other Ambulatory Visit: Payer: Self-pay

## 2017-10-25 ENCOUNTER — Other Ambulatory Visit: Payer: Self-pay | Admitting: *Deleted

## 2017-10-25 DIAGNOSIS — I10 Essential (primary) hypertension: Secondary | ICD-10-CM | POA: Diagnosis not present

## 2017-10-25 DIAGNOSIS — R293 Abnormal posture: Secondary | ICD-10-CM | POA: Insufficient documentation

## 2017-10-25 DIAGNOSIS — C50412 Malignant neoplasm of upper-outer quadrant of left female breast: Secondary | ICD-10-CM | POA: Insufficient documentation

## 2017-10-25 DIAGNOSIS — Z170 Estrogen receptor positive status [ER+]: Secondary | ICD-10-CM | POA: Diagnosis not present

## 2017-10-25 DIAGNOSIS — Z9889 Other specified postprocedural states: Secondary | ICD-10-CM | POA: Diagnosis not present

## 2017-10-25 DIAGNOSIS — Z79899 Other long term (current) drug therapy: Secondary | ICD-10-CM | POA: Diagnosis not present

## 2017-10-25 DIAGNOSIS — Z800 Family history of malignant neoplasm of digestive organs: Secondary | ICD-10-CM | POA: Diagnosis not present

## 2017-10-25 DIAGNOSIS — Z803 Family history of malignant neoplasm of breast: Secondary | ICD-10-CM | POA: Diagnosis not present

## 2017-10-25 LAB — CMP (CANCER CENTER ONLY)
ALT: 18 U/L (ref 0–55)
AST: 19 U/L (ref 5–34)
Albumin: 4.5 g/dL (ref 3.5–5.0)
Alkaline Phosphatase: 86 U/L (ref 40–150)
Anion gap: 7 (ref 3–11)
BILIRUBIN TOTAL: 0.4 mg/dL (ref 0.2–1.2)
BUN: 25 mg/dL (ref 7–26)
CHLORIDE: 104 mmol/L (ref 98–109)
CO2: 28 mmol/L (ref 22–29)
CREATININE: 1.23 mg/dL — AB (ref 0.60–1.10)
Calcium: 10 mg/dL (ref 8.4–10.4)
GFR, EST NON AFRICAN AMERICAN: 41 mL/min — AB (ref >60)
GFR, Est AFR Am: 47 mL/min — ABNORMAL LOW (ref >60)
Glucose, Bld: 137 mg/dL (ref 70–140)
Potassium: 4.9 mmol/L (ref 3.5–5.1)
Sodium: 139 mmol/L (ref 136–145)
Total Protein: 7.8 g/dL (ref 6.4–8.3)

## 2017-10-25 LAB — CBC WITH DIFFERENTIAL (CANCER CENTER ONLY)
Basophils Absolute: 0.1 10*3/uL (ref 0.0–0.1)
Basophils Relative: 1 %
EOS ABS: 0.2 10*3/uL (ref 0.0–0.5)
EOS PCT: 3 %
HCT: 37.8 % (ref 34.8–46.6)
Hemoglobin: 12.9 g/dL (ref 11.6–15.9)
LYMPHS ABS: 2 10*3/uL (ref 0.9–3.3)
Lymphocytes Relative: 30 %
MCH: 31.3 pg (ref 25.1–34.0)
MCHC: 34.2 g/dL (ref 31.5–36.0)
MCV: 91.4 fL (ref 79.5–101.0)
Monocytes Absolute: 0.5 10*3/uL (ref 0.1–0.9)
Monocytes Relative: 8 %
Neutro Abs: 3.9 10*3/uL (ref 1.5–6.5)
Neutrophils Relative %: 58 %
PLATELETS: 254 10*3/uL (ref 145–400)
RBC: 4.13 MIL/uL (ref 3.70–5.45)
RDW: 12 % (ref 11.2–14.5)
WBC: 6.7 10*3/uL (ref 3.9–10.3)

## 2017-10-25 MED ORDER — LORAZEPAM 1 MG PO TABS: 1.0000 mg | ORAL_TABLET | Freq: Three times a day (TID) | ORAL | 0 refills | Status: DC | PRN

## 2017-10-25 NOTE — Patient Instructions (Signed)

## 2017-10-25 NOTE — Progress Notes (Signed)
[facility] NOTE  Patient Care Team: Burnard Bunting, MD as PCP - General (Internal Medicine) [name], MD as Consulting Physician (General Surgery) Nicholas Lose, MD as Consulting Physician (Hematology and Oncology) Eppie Gibson, MD as Attending Physician (Radiation Oncology)  CHIEF COMPLAINTS/PURPOSE OF CONSULTATION:  Newly diagnosed breast cancer  HISTORY OF PRESENTING ILLNESS:  Beth [name] 77 y.o. female is here because of recent diagnosis of left breast cancer patient had a screening mammogram that detected abnormality left breast at 3 o'clock position.  Subsequently patient also felt a lump.  It measured 2.8 cm.  Biopsy of this mass came back as grade 2 invasive lobular cancer that was ER PR positive HER-2 negative with a Ki-67 of 2%.  She was presented this morning to the multidisciplinary tumor board and she is here today accompanied by her friend to discuss treatment plan. She enjoys traveling and enjoys life.  She drinks quite a large amount of alcohol every week.  I reviewed her records extensively and collaborated the history with the patient.  SUMMARY OF ONCOLOGIC HISTORY:   Malignant neoplasm of upper-outer quadrant of left breast in female, estrogen receptor positive ([place])   10/09/2017 Initial Diagnosis    Screening detected left breast mass at 3 o'clock position 2.8 x 2.8 x 2.2 cm, lymph nodes negative, biopsy revealed grade 2 invasive lobular cancer ER 95%, PR 95%, Ki-67 2%, HER-2 negative ratio 1.34, T2N0 stage Ib clinical stage AJCC 8        Past Medical History:  Diagnosis Date  . Hypertension     SURGICAL HISTORY: Past Surgical History:  Procedure Laterality Date  . BREAST EXCISIONAL BIOPSY Bilateral 1960  . BREAST SURGERY Bilateral    breast lumpectomy-benign 60's  . BUNIONECTOMY Bilateral    70's  . CHOLECYSTECTOMY N/A 03/22/2016   Procedure: LAPAROSCOPIC CHOLECYSTECTOMY WITH INTRAOPERATIVE CHOLANGIOGRAM;  Surgeon: Armandina Gemma, MD;  Location: [facility];  Service: General;  Laterality: N/A;  . CYST EXCISION Right    thumb  . TONSILLECTOMY      SOCIAL HISTORY: Social History   Socioeconomic History  . Marital status: Widowed    Spouse name: Not on file  . Number of children: Not on file  . Years of education: Not on file  . Highest education level: Not on file  Occupational History  . Not on file  Social Needs  . Financial resource strain: Not on file  . Food insecurity:    Worry: Not on file    Inability: Not on file  . Transportation needs:    Medical: Not on file    Non-medical: Not on file  Tobacco Use  . Smoking status: Never Smoker  . Smokeless tobacco: Never Used  Substance and Sexual Activity  . Alcohol use: Yes    Comment: social  . Drug use: No  . Sexual activity: Not Currently  Lifestyle  . Physical activity:    Days per week: Not on file    Minutes per session: Not on file  . Stress: Not on file  Relationships  . Social connections:    Talks on phone: Not on file    Gets together: Not on file    Attends religious service: Not on file    Active member of club or organization: Not on file    Attends meetings of clubs or organizations: Not on file    Relationship status: Not on file  . Intimate partner violence:    Fear of current or  ex partner: Not on file    Emotionally abused: Not on file    Physically abused: Not on file    Forced sexual activity: Not on file  Other Topics Concern  . Not on file  Social History Narrative  . Not on file    FAMILY HISTORY: Family History  Problem Relation Age of Onset  . Colon cancer Maternal Aunt   . Breast cancer Maternal Aunt   . Colon cancer Maternal Aunt     ALLERGIES:  is allergic to aleve [naproxen sodium].  MEDICATIONS:  Current Outpatient Medications  Medication Sig Dispense Refill  . lisinopril (PRINIVIL,ZESTRIL) 20 MG tablet Take 20 mg by mouth every morning.    . Biotin (BIOTIN 5000) 5 MG CAPS Take 5 mg by  mouth daily.    . cholecalciferol (VITAMIN D) 1000 units tablet Take 1,000 Units by mouth daily.    . Glucosamine-Chondroit-Vit C-Mn (GLUCOSAMINE 1500 COMPLEX PO) Take 3,000 mg by mouth daily.    Marland Kitchen LORazepam (ATIVAN) 1 MG tablet Take 1 tablet (1 mg total) by mouth every 8 (eight) hours as needed for anxiety (or nausea). 2 tablet 0  . Multiple Vitamins-Minerals (OCUVITE EXTRA PO) Take 1 tablet by mouth daily.    Marland Kitchen omeprazole (PRILOSEC) 20 MG capsule Take 20 mg by mouth daily before breakfast.    . Potassium 99 MG TABS Take 99 mg by mouth daily.    . Red Yeast Rice Extract 600 MG CAPS Take 600 mg by mouth daily.    . vitamin B-12 (CYANOCOBALAMIN) 1000 MCG tablet Take 1,000 mcg by mouth daily.     Current Facility-Administered Medications  Medication Dose Route Frequency Provider Last Rate Last Dose  . 0.9 %  sodium chloride infusion  500 mL Intravenous Continuous Irene Shipper, MD        REVIEW OF SYSTEMS:   Constitutional: Denies fevers, chills or abnormal night sweats Eyes: Denies blurriness of vision, double vision or watery eyes Ears, nose, mouth, throat, and face: Denies mucositis or sore throat Respiratory: Denies cough, dyspnea or wheezes Cardiovascular: Denies palpitation, chest discomfort or lower extremity swelling Gastrointestinal:  Denies nausea, heartburn or change in bowel habits Skin: Denies abnormal skin rashes Lymphatics: Denies new lymphadenopathy or easy bruising Neurological:Denies numbness, tingling or new weaknesses Behavioral/Psych: Mood is stable, no new changes  All other systems were reviewed with the patient and are negative.  PHYSICAL EXAMINATION: ECOG PERFORMANCE STATUS: 1 - Symptomatic but completely ambulatory  Vitals:   10/25/17 1254  BP: (!) 163/76  Pulse: 80  Resp: 17  Temp: 97.8 F (36.6 C)  SpO2: 100%   Filed Weights   10/25/17 1254  Weight: 162 lb 12.8 oz (73.8 kg)    GENERAL:alert, no distress and comfortable SKIN: skin color, texture,  turgor are normal, no rashes or significant lesions EYES: normal, conjunctiva are pink and non-injected, sclera clear OROPHARYNX:no exudate, no erythema and lips, buccal mucosa, and tongue normal  NECK: supple, thyroid normal size, non-tender, without nodularity LYMPH:  no palpable lymphadenopathy in the cervical, axillary or inguinal LUNGS: clear to auscultation and percussion with normal breathing effort HEART: regular rate & rhythm and no murmurs and no lower extremity edema ABDOMEN:abdomen soft, non-tender and normal bowel sounds Musculoskeletal:no cyanosis of digits and no clubbing  PSYCH: alert & oriented x 3 with fluent speech NEURO: no focal motor/sensory deficits   LABORATORY DATA:  I have reviewed the data as listed Lab Results  Component Value Date   WBC 6.7 10/25/2017  HGB 12.9 10/25/2017   HCT 37.8 10/25/2017   MCV 91.4 10/25/2017   PLT 254 10/25/2017   Lab Results  Component Value Date   NA 139 10/25/2017   K 4.9 10/25/2017   CL 104 10/25/2017   CO2 28 10/25/2017    RADIOGRAPHIC STUDIES: I have personally reviewed the radiological reports and agreed with the findings in the report.  ASSESSMENT AND PLAN:  Malignant neoplasm of upper-outer quadrant of left breast in female, estrogen receptor positive ([place]) 10/09/2017 Screening detected left breast mass at 3 o'clock position 2.8 x 2.8 x 2.2 cm, lymph nodes negative, biopsy revealed grade 2 invasive lobular cancer ER 95%, PR 95%, Ki-67 2%, HER-2 negative ratio 1.34, T2N0 stage Ib clinical stage AJCC 8   Pathology and radiology counseling:Discussed with the patient, the details of pathology including the type of breast cancer,the clinical staging, the significance of ER, PR and HER-2/neu receptors and the implications for treatment. After reviewing the pathology in detail, we proceeded to discuss the different treatment options between surgery, radiation, antiestrogen therapies.  Recommendations: 1. Breast conserving  surgery with sentinel lymph node biopsy followed by 2. Adjuvant radiation therapy followed by 3. Adjuvant antiestrogen therapy  I did not recommend Oncotype DX testing because she is 77 years old and has a lobular cancer with favorable risk characteristics. Return to clinic after surgery to discuss final pathology report    All questions were answered. The patient knows to call the clinic with any problems, questions or concerns.    [name], MD 10/25/17

## 2017-10-25 NOTE — Progress Notes (Signed)
Nutrition Assessment  Reason for Assessment:  Pt seen in Breast Clinic  ASSESSMENT:   77 year old female with new diagnosis of breast cancer.  Past medical history of HTN.    Patient reports normal appetite  Medications:  reviewed  Labs: creatinine 1.23  Anthropometrics:   Height: 63 inches Weight: 162 lb 12.8 oz BMI: 28   NUTRITION DIAGNOSIS: Food and nutrition related knowledge deficit related to new diagnosis of breast cancer as evidenced by no prior need for nutrition related information.  INTERVENTION:   Discussed and provided packet of information regarding nutritional tips for breast cancer patients.  Questions answered.  Teachback method used.  Contact information provided and patient knows to contact me with questions/concerns.    MONITORING, EVALUATION, and GOAL: Pt will consume a healthy plant based diet to maintain lean body mass throughout treatment.   Rosabell Geyer B. Zenia Resides, [place], [place] Registered Dietitian [phone] (pager)

## 2017-10-25 NOTE — Assessment & Plan Note (Signed)
10/09/2017 Screening detected left breast mass at 3 o'clock position 2.8 x 2.8 x 2.2 cm, lymph nodes negative, biopsy revealed grade 2 invasive lobular cancer ER 95%, PR 95%, Ki-67 2%, HER-2 negative ratio 1.34, T2N0 stage Ib clinical stage AJCC 8   Pathology and radiology counseling:Discussed with the patient, the details of pathology including the type of breast cancer,the clinical staging, the significance of ER, PR and HER-2/neu receptors and the implications for treatment. After reviewing the pathology in detail, we proceeded to discuss the different treatment options between surgery, radiation, antiestrogen therapies.  Recommendations: 1. Breast conserving surgery with sentinel lymph node biopsy followed by 2. Adjuvant radiation therapy followed by 3. Adjuvant antiestrogen therapy  I did not recommend Oncotype DX testing because she is 77 years old and has a lobular cancer with favorable risk characteristics. Return to clinic after surgery to discuss final pathology report

## 2017-10-25 NOTE — Progress Notes (Signed)
Radiation Oncology         [phone] ________________________________  Initial Outpatient Consultation  Name: Beth [name] MRN: [id number]  Date: 10/25/2017  DOB: Sep 28, 1940  [id number], Delfino Lovett, MD  [name], MD   REFERRING PHYSICIAN: [name], MD  DIAGNOSIS:    ICD-10-CM   1. 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 female who presented with screening detected left breast mass on 09/28/17 mammography. Diagnostic mammogram and ultrasound on 10/06/17 showed the suspicious mass in the 3:00 position of the left breast, 5 cm from the nipple, measuring 2.8 x 2.2 x 2.8 cm. The left axilla was negative on ultrasound. Biopsy of the mass on 10/09/17 showed invasive lobular carcinoma with characteristics as described above in the diagnosis.  The patient presents to our [facility] today for evaluation and discussion of potential treatment options.   On review of systems, she is positive for some loss of sleep. She is positive for pain in her knee and neck. She is positive for arthritis. She wears glasses.  She has a family history of breast cancer in a maternal aunt and colon cancer in a second maternal aunt.   OB/GYN History: She had her first menstrual period at age 23. She is no longer having periods. Approximate date of last period was in 1980. She has never used hormone replacement. She has never given birth. She used birth control pills as contraception from 50s to 35s.   PREVIOUS RADIATION THERAPY: No  PAST MEDICAL HISTORY:  has a past medical history of Hypertension.    PAST SURGICAL HISTORY: Past Surgical History:  Procedure Laterality  Date  . BREAST EXCISIONAL BIOPSY Bilateral 1960  . BREAST SURGERY Bilateral    breast lumpectomy-benign 60's  . BUNIONECTOMY Bilateral    70's  . CHOLECYSTECTOMY N/A 03/22/2016   Procedure: LAPAROSCOPIC CHOLECYSTECTOMY WITH INTRAOPERATIVE CHOLANGIOGRAM;  Surgeon: Armandina Gemma, MD;  Location: [facility];  Service: General;  Laterality: N/A;  . CYST EXCISION Right    thumb  . TONSILLECTOMY      FAMILY HISTORY: family history includes Breast cancer in her maternal aunt; Colon cancer in her maternal aunt and maternal aunt.  SOCIAL HISTORY:  reports that she has never smoked. She has never used smokeless tobacco. She reports that she drinks alcohol. She reports that she does not use drugs. Widowed. 2 adopted children. Homemaker.   ALLERGIES: Aleve [naproxen sodium]  MEDICATIONS:  Current Outpatient Medications  Medication Sig Dispense Refill  . Biotin (BIOTIN 5000) 5 MG CAPS Take 5 mg by mouth daily.    . cholecalciferol (VITAMIN D) 1000 units tablet Take 1,000 Units by mouth daily.    . Glucosamine-Chondroit-Vit C-Mn (GLUCOSAMINE 1500 COMPLEX PO) Take 3,000 mg by mouth daily.    Marland Kitchen lisinopril (PRINIVIL,ZESTRIL) 20 MG tablet Take 20 mg by mouth every morning.    Marland Kitchen LORazepam (ATIVAN) 1 MG tablet Take 1 tablet (1 mg total) by mouth every 8 (eight) hours as needed for anxiety (or nausea). 2 tablet 0  . Multiple Vitamins-Minerals (OCUVITE EXTRA PO) Take 1 tablet by mouth daily.    Marland Kitchen omeprazole (PRILOSEC) 20 MG capsule Take 20 mg by mouth daily before breakfast.    .  Potassium 99 MG TABS Take 99 mg by mouth daily.    . Red Yeast Rice Extract 600 MG CAPS Take 600 mg by mouth daily.    . vitamin B-12 (CYANOCOBALAMIN) 1000 MCG tablet Take 1,000 mcg by mouth daily.     Current Facility-Administered Medications  Medication Dose Route Frequency Provider Last Rate Last Dose  . 0.9 %  sodium chloride infusion  500 mL Intravenous Continuous Irene Shipper, MD        REVIEW OF SYSTEMS: A 10+ POINT REVIEW  OF SYSTEMS WAS OBTAINED including neurology, dermatology, psychiatry, cardiac, respiratory, lymph, extremities, GI, GU, Musculoskeletal, constitutional, breasts, reproductive, HEENT.  All pertinent positives are noted in the HPI.  All others are negative.   PHYSICAL EXAM:  Vitals with BMI 10/25/2017  Height 5' 3"   Weight 162 lbs 13 oz  BMI 91.79  Systolic 150  Diastolic 76  Pulse 80  Respirations 17   General: Alert and oriented, in no acute distress. HEENT: Head is normocephalic. Extraocular movements are intact. Oropharynx is clear. Neck: Neck is supple, no palpable cervical or supraclavicular lymphadenopathy. Heart: Regular in rate and rhythm with no murmurs, rubs, or gallops. Chest: Clear to auscultation bilaterally, with no rhonchi, wheezes, or rales. Abdomen: Soft, nontender, nondistended, with no rigidity or guarding. Extremities: No cyanosis or edema. Lymphatics: see Neck Exam Skin: No concerning lesions. Musculoskeletal: Symmetric strength and muscle tone throughout. Neurologic: Cranial nerves II through XII are grossly intact. No obvious focalities. Speech is fluent. Coordination is intact. Psychiatric: Judgment and insight are intact. Affect is appropriate. Breasts: She had a 5 cm mass around 3:00 on the left breast. No other palpable masses appreciated in the breasts or axillae.    ECOG = 0  0 - Asymptomatic (Fully active, able to carry on all predisease activities without restriction)  1 - Symptomatic but completely ambulatory (Restricted in physically strenuous activity but ambulatory and able to carry out work of a light or sedentary nature. For example, light housework, office work)  2 - Symptomatic, <50% in bed during the day (Ambulatory and capable of all self care but unable to carry out any work activities. Up and about more than 50% of waking hours)  3 - Symptomatic, >50% in bed, but not bedbound (Capable of only limited self-care, confined to bed or chair 50%  or more of waking hours)  4 - Bedbound (Completely disabled. Cannot carry on any self-care. Totally confined to bed or chair)  5 - Death   Eustace Pen MM, Creech RH, Tormey DC, et al. [phone]). "Toxicity and response criteria of the [facility]". [place] Oncol. 5 (6): 649-55   LABORATORY DATA:  Lab Results  Component Value Date   WBC 6.7 10/25/2017   HGB 12.9 10/25/2017   HCT 37.8 10/25/2017   MCV 91.4 10/25/2017   PLT 254 10/25/2017   CMP     Component Value Date/Time   NA 139 10/25/2017 1226   K 4.9 10/25/2017 1226   CL 104 10/25/2017 1226   CO2 28 10/25/2017 1226   GLUCOSE 137 10/25/2017 1226   BUN 25 10/25/2017 1226   CREATININE 1.23 (H) 10/25/2017 1226   CALCIUM 10.0 10/25/2017 1226   PROT 7.8 10/25/2017 1226   ALBUMIN 4.5 10/25/2017 1226   AST 19 10/25/2017 1226   ALT 18 10/25/2017 1226   ALKPHOS 86 10/25/2017 1226   BILITOT 0.4 10/25/2017 1226   GFRNONAA 41 (L) 10/25/2017 1226   GFRAA 47 (L) 10/25/2017 1226  RADIOGRAPHY: US Breast Ltd Uni Left Inc Axilla  Result Date: 10/06/2017 CLINICAL DATA:  Possible LEFT breast mass. Remote benign excisional biopsy in the UPPER-OUTER QUADRANT of the LEFT breast. EXAM: DIGITAL DIAGNOSTIC LEFT MAMMOGRAM WITH CAD AND TOMO ULTRASOUND LEFT BREAST COMPARISON:  09/28/2017 and earlier ACR Breast Density Category c: The breast tissue is heterogeneously dense, which may obscure small masses. FINDINGS: Additional 2-D and 3-D images are performed. Spot compression views confirm presence of a spiculated mass in the LATERAL aspect of the LEFT breast. Mammographic images were processed with CAD. On physical exam, I palpate a discrete firm mass in the 3 o'clock location of the LEFT breast. Targeted ultrasound is performed, showing irregular hypoechoic mass with irregular margins and posterior acoustic shadowing in the 3 o'clock location of the LEFT breast 5 centimeters from the nipple. Mass is 2.8 x 2.2 x 2.8  centimeters. Evaluation of the LEFT axilla is negative for adenopathy. IMPRESSION: Suspicious mass in the 3 o'clock location of the LEFT breast warranting biopsy. RECOMMENDATION: Ultrasound-guided core biopsy is recommended and scheduled for the patient. I have discussed the findings and recommendations with the patient. Results were also provided in writing at the conclusion of the visit. If applicable, a reminder letter will be sent to the patient regarding the next appointment. BI-RADS CATEGORY  4: Suspicious. Electronically Signed   By: Nolon Nations M.D.   On: 10/06/2017 15:25   Mm Diag Breast Tomo Uni Left  Result Date: 10/06/2017 CLINICAL DATA:  Possible LEFT breast mass. Remote benign excisional biopsy in the UPPER-OUTER QUADRANT of the LEFT breast. EXAM: DIGITAL DIAGNOSTIC LEFT MAMMOGRAM WITH CAD AND TOMO ULTRASOUND LEFT BREAST COMPARISON:  09/28/2017 and earlier ACR Breast Density Category c: The breast tissue is heterogeneously dense, which may obscure small masses. FINDINGS: Additional 2-D and 3-D images are performed. Spot compression views confirm presence of a spiculated mass in the LATERAL aspect of the LEFT breast. Mammographic images were processed with CAD. On physical exam, I palpate a discrete firm mass in the 3 o'clock location of the LEFT breast. Targeted ultrasound is performed, showing irregular hypoechoic mass with irregular margins and posterior acoustic shadowing in the 3 o'clock location of the LEFT breast 5 centimeters from the nipple. Mass is 2.8 x 2.2 x 2.8 centimeters. Evaluation of the LEFT axilla is negative for adenopathy. IMPRESSION: Suspicious mass in the 3 o'clock location of the LEFT breast warranting biopsy. RECOMMENDATION: Ultrasound-guided core biopsy is recommended and scheduled for the patient. I have discussed the findings and recommendations with the patient. Results were also provided in writing at the conclusion of the visit. If applicable, a reminder letter  will be sent to the patient regarding the next appointment. BI-RADS CATEGORY  4: Suspicious. Electronically Signed   By: Nolon Nations M.D.   On: 10/06/2017 15:25   Mm Screening Breast Tomo Bilateral  Result Date: 09/29/2017 CLINICAL DATA:  Screening. EXAM: DIGITAL SCREENING BILATERAL MAMMOGRAM WITH TOMO AND CAD COMPARISON:  Previous exam(s). ACR Breast Density Category c: The breast tissue is heterogeneously dense, which may obscure small masses. FINDINGS: In the left breast, a possible mass warrants further evaluation. In the right breast, no findings suspicious for malignancy. Images were processed with CAD. IMPRESSION: Further evaluation is suggested for possible mass in the left breast. RECOMMENDATION: Diagnostic mammogram and possibly ultrasound of the left breast. (Code:[id number]) The patient will be contacted regarding the findings, and additional imaging will be scheduled. BI-RADS CATEGORY  0: Incomplete. Need additional imaging evaluation and/or prior mammograms  for comparison. Electronically Signed   By: Claudie Revering M.D.   On: 09/29/2017 16:06   Mm Clip Placement Left  Result Date: 10/09/2017 CLINICAL DATA:  Post biopsy mammogram of the left breast for clip placement. EXAM: DIAGNOSTIC LEFT MAMMOGRAM POST ULTRASOUND BIOPSY COMPARISON:  Previous exam(s). FINDINGS: Mammographic images were obtained following ultrasound guided biopsy of left breast mass at 3 o'clock. The ribbon shaped biopsy marking clip is well positioned within the biopsied mass at 3 o'clock in the left breast. IMPRESSION: Appropriate positioning of the ribbon shaped biopsy marking clip at 3 o'clock in the left breast. Final Assessment: Post Procedure Mammograms for Marker Placement Electronically Signed   By: Ammie Ferrier M.D.   On: 10/09/2017 16:30   [place] Lt Breast Bx W Loc Dev 1st Lesion Img Bx Spec US Guide  Addendum Date: 10/12/2017   ADDENDUM REPORT: 10/11/2017 11:30 ADDENDUM: Pathology revealed INVASIVE MAMMARY  CARCINOMA of LEFT breast, 3 o'clock. This is consistent with invasive lobular carcinoma. This was found to be concordant by Dr. Ammie Ferrier. Pathology results were discussed with the patient by telephone. The patient reported doing well after the biopsy with tenderness at the site. Post biopsy instructions and care were reviewed and questions were answered. The patient was encouraged to call [facility] for any additional concerns. The patient was referred to [facility] at [facility] on Oct 25, 2017, per patient request due to a preplanned vacation. A breast MRI is recommended due to lobular histology. Pathology results reported by Roselind Messier, RN on 10/11/2017. Electronically Signed   By: Ammie Ferrier M.D.   On: 10/11/2017 11:30   Result Date: 10/12/2017 CLINICAL DATA:  77 year old female presenting for ultrasound-guided biopsy of a left breast mass. EXAM: ULTRASOUND GUIDED LEFT BREAST CORE NEEDLE BIOPSY COMPARISON:  Previous exam(s). FINDINGS: I met with the patient and we discussed the procedure of ultrasound-guided biopsy, including benefits and alternatives. We discussed the high likelihood of a successful procedure. We discussed the risks of the procedure, including infection, bleeding, tissue injury, clip migration, and inadequate sampling. Informed written consent was given. The usual time-out protocol was performed immediately prior to the procedure. Lesion quadrant: Upper-outer quadrant Using sterile technique and 1% Lidocaine as local anesthetic, under direct ultrasound visualization, a 14 gauge spring-loaded device was used to perform biopsy of a mass in the left breast just slightly superior to 3 o'clock using a lateral approach. At the conclusion of the procedure a ribbon shaped tissue marker clip was deployed into the biopsy cavity. Follow up 2 view mammogram was performed and dictated separately.  IMPRESSION: Ultrasound guided biopsy of a left breast mass at 3 o'clock. No apparent complications. Electronically Signed: By: Ammie Ferrier M.D. On: 10/09/2017 16:30      IMPRESSION/PLAN: Left Breast Cancer  She would like to pursue breast conservation if possible and will undergo MRI to verify if this is feasible. We talked about the role that radiotherapy would play in the setting of breast conservation. This will be followed by adjuvant anti-estrogen therapy.  It was a pleasure meeting the patient today. We discussed the risks, benefits, and side effects of radiotherapy. If she undergoes breast conservation, I would recommend radiotherapy to the left breast to reduce her risk of locoregional recurrence by 2/3.  We discussed that radiation would take approximately 4-6 weeks to complete and that I would give the patient a few weeks to heal following surgery before starting  treatment planning. We spoke about acute effects including skin irritation and fatigue as well as much less common late effects including internal organ injury or irritation. We spoke about the latest technology that is used to minimize the risk of late effects for patients undergoing radiotherapy to the breast or chest wall. No guarantees of treatment were given. The patient is enthusiastic about proceeding with treatment. I look forward to participating in the patient's care.  I will await her referral back to me for postoperative follow-up and eventual CT simulation/treatment planning.    __________________________________________   Eppie Gibson, MD  This document serves as a record of services personally performed by Eppie Gibson, MD. It was created on her behalf by Rae Lips, a trained medical scribe. The creation of this record is based on the scribe's personal observations and the provider's statements to them. This document has been checked and approved by the attending provider.

## 2017-10-25 NOTE — Therapy (Signed)
[place], [place], [address] Phone: [phone]   Fax:  [phone]  Physical Therapy Evaluation  Patient Details  Name: Beth [name] MRN: [id number] Date of Birth: [birth year] Referring Provider: Dr. [name]   Encounter Date: 10/25/2017  PT End of Session - 10/25/17 1526    Visit Number  1    Number of Visits  2    Date for PT Re-Evaluation  12/20/17    PT Start Time  1351    PT Stop Time  1415    PT Time Calculation (min)  24 min    Activity Tolerance  Patient tolerated treatment well    Behavior During Therapy  [facility] for tasks assessed/performed       Past Medical History:  Diagnosis Date  . Hypertension     Past Surgical History:  Procedure Laterality Date  . BREAST EXCISIONAL BIOPSY Bilateral 1960  . BREAST SURGERY Bilateral    breast lumpectomy-benign 60's  . BUNIONECTOMY Bilateral    70's  . CHOLECYSTECTOMY N/A 03/22/2016   Procedure: LAPAROSCOPIC CHOLECYSTECTOMY WITH INTRAOPERATIVE CHOLANGIOGRAM;  Surgeon: Armandina Gemma, MD;  Location: [facility];  Service: General;  Laterality: N/A;  . CYST EXCISION Right    thumb  . TONSILLECTOMY      There were no vitals filed for this visit.   Subjective Assessment - 10/25/17 1507    Subjective  Patient reports she is here today to be seen by her medical team for her newly diagnosed left breast cancer.    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 It measures 2.8 cm and is located in the upper outer quadrant. It is ER/PR positive and HER2 negative with a Ki67 of 2%.    Patient Stated Goals  Reduce lymphedema risk and learn post op shoulder ROM HEP    Currently in Pain?  Yes    Pain Score  4     Pain Location  Neck    Pain Orientation  Left    Pain Descriptors / Indicators  Aching    Pain Onset  More than a month ago    Pain Frequency  Intermittent    Aggravating  Factors   stress    Multiple Pain Sites  No         [facility] PT Assessment - 10/25/17 0001      Assessment   Medical Diagnosis  Left breast cancer    Referring Provider  Dr. [name]    Onset Date/Surgical Date  09/28/17    Hand Dominance  Left    Prior Therapy  none      Precautions   Precautions  Other (comment)    Precaution Comments  active cancer      Restrictions   Weight Bearing Restrictions  No      Balance Screen   Has the patient fallen in the past 6 months  No    Has the patient had a decrease in activity level because of a fear of falling?   No    Is the patient reluctant to leave their home because of a fear of falling?   No      Prior Function   Level of Independence  Independent    Vocation  Retired    Leisure  She goes bowling 3x/week      Cognition   Overall Cognitive Status  Within Functional Limits for tasks assessed  Posture/Postural Control   Posture/Postural Control  Postural limitations    Postural Limitations  Rounded Shoulders;Forward head;Increased thoracic kyphosis      ROM / Strength   AROM / PROM / Strength  AROM;Strength      AROM   AROM Assessment Site  Shoulder;Cervical    Right/Left Shoulder  Right;Left    Right Shoulder Extension  50 Degrees    Right Shoulder Flexion  159 Degrees    Right Shoulder ABduction  157 Degrees    Right Shoulder Internal Rotation  45 Degrees    Right Shoulder External Rotation  80 Degrees    Left Shoulder Extension  46 Degrees    Left Shoulder Flexion  141 Degrees    Left Shoulder ABduction  140 Degrees    Left Shoulder Internal Rotation  61 Degrees    Left Shoulder External Rotation  80 Degrees    Cervical Flexion  WNL    Cervical Extension  WNL    Cervical - Right Side Bend  WNL    Cervical - Left Side Bend  25% limited    Cervical - Right Rotation  WNL    Cervical - Left Rotation  25% limited      Strength   Overall Strength  Within functional limits for tasks performed         LYMPHEDEMA/ONCOLOGY QUESTIONNAIRE - 10/25/17 1524      Type   Cancer Type  Left breast cancer      Lymphedema Assessments   Lymphedema Assessments  Upper extremities      Right Upper Extremity Lymphedema   10 cm Proximal to Olecranon Process  26.8 cm    Olecranon Process  24.3 cm    10 cm Proximal to Ulnar Styloid Process  20 cm    Just Proximal to Ulnar Styloid Process  14.1 cm    Across Hand at [employer]  17.3 cm    At [place] of 2nd Digit  5.9 cm      Left Upper Extremity Lymphedema   10 cm Proximal to Olecranon Process  27.3 cm    Olecranon Process  24.7 cm    10 cm Proximal to Ulnar Styloid Process  19.9 cm    Just Proximal to Ulnar Styloid Process  14.2 cm    Across Hand at [employer]  17.7 cm    At [place] of 2nd Digit  6.1 cm             Objective measurements completed on examination: See above findings.      Patient was instructed today in a home exercise program today for post op shoulder range of motion. These included active assist shoulder flexion in sitting, scapular retraction, wall walking with shoulder abduction, and hands behind head external rotation.  She was encouraged to do these twice a day, holding 3 seconds and repeating 5 times when permitted by her physician.        PT Education - 10/25/17 1525    Education provided  Yes    Education Details  Lymphedema risk reduction and post op shoulder ROM HEP    Person(s) Educated  Patient    Methods  Explanation;Demonstration;Handout    Comprehension  Returned demonstration;Verbalized understanding          PT Long Term Goals - 10/25/17 1530      PT LONG TERM GOAL #1   Title  Patient will demonstrate she has returned to baseline post operatively related to shoulder ROM and  function.    Time  8    Period  Weeks    Status  New      Breast Clinic Goals - 10/25/17 1530      Patient will be able to verbalize understanding of pertinent lymphedema risk reduction practices  relevant to her diagnosis specifically related to skin care.   Time  1    Period  Days    Status  Achieved      Patient will be able to return demonstrate and/or verbalize understanding of the post-op home exercise program related to regaining shoulder range of motion.   Time  1    Period  Days    Status  Achieved      Patient will be able to verbalize understanding of the importance of attending the postoperative After Breast Cancer Class for further lymphedema risk reduction education and therapeutic exercise.   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 It measures 2.8 cm and is located in the upper outer quadrant. It is ER/PR positive and HER2 negative with a Ki67 of 2%. Her multidisciplinary medical team met prior to her assessments to determine a recommended treatment plan. She is planning to have a left lumpectomy and sentinel node biopsy followed by radiation and anti-estrogen therapy. She will benefit from a post op PT visit to reassess and determine needs.    Clinical Presentation  Stable    Clinical Decision Making  Low    Rehab Potential  Excellent    Clinical Impairments Affecting Rehab Potential  None    PT Frequency  -- eval and 1 f/u visit    PT Treatment/Interventions  ADLs/Self Care Home Management;Therapeutic exercise;Patient/family education    PT Next Visit Plan  Will reassess 3-4 weeks post op to determine needs    PT Home Exercise Plan  Post op shoulder ROM HEP    Consulted and Agree with Plan of Care  Patient       Patient will benefit from skilled therapeutic intervention in order to improve the following deficits and impairments:  Decreased knowledge of precautions, Impaired UE functional use, Decreased range of motion, Postural dysfunction, Pain  Visit Diagnosis: Malignant neoplasm of  upper-outer quadrant of left breast in female, estrogen receptor positive ([place]) - Plan: PT plan of care cert/re-cert  Abnormal posture - Plan: PT plan of care cert/re-cert   Patient will follow up at outpatient cancer rehab 3-4 weeks following surgery.  If the patient requires physical therapy at that time, a specific plan will be dictated and sent to the referring physician for approval. The patient was educated today on appropriate basic range of motion exercises to begin post operatively and the importance of attending the After Breast Cancer class following surgery.  Patient was educated today on lymphedema risk reduction practices as it pertains to recommendations that will benefit the patient immediately following surgery.  She verbalized good understanding.     Problem List Patient Active Problem List   Diagnosis Date Noted  . Malignant neoplasm of upper-outer quadrant of left breast in female, estrogen receptor positive ([place]) 10/13/2017  . Unilateral primary osteoarthritis, right knee 07/17/2017  . Chronic pain of right knee 07/17/2017  . Cholelithiasis with chronic cholecystitis 03/20/2016    Inez Catalina  Junious Dresser, PT 10/25/17 3:33 PM  [place], [place], [address] Phone: [phone]   Fax:  [phone]  Name: Beth [name] MRN: [id number] Date of Birth: Oct 25, 1940

## 2017-10-26 ENCOUNTER — Encounter: Payer: Self-pay | Admitting: Radiation Oncology

## 2017-11-01 ENCOUNTER — Telehealth: Payer: Self-pay | Admitting: *Deleted

## 2017-11-01 ENCOUNTER — Ambulatory Visit (HOSPITAL_COMMUNITY): Payer: Medicare Other

## 2017-11-01 ENCOUNTER — Ambulatory Visit (HOSPITAL_COMMUNITY)
Admission: RE | Admit: 2017-11-01 | Discharge: 2017-11-01 | Disposition: A | Discharge status: Home / Self Care | Payer: Medicare Other | Source: Ambulatory Visit | Attending: General Surgery | Admitting: General Surgery

## 2017-11-01 DIAGNOSIS — C50912 Malignant neoplasm of unspecified site of left female breast: Secondary | ICD-10-CM | POA: Diagnosis not present

## 2017-11-01 DIAGNOSIS — Z170 Estrogen receptor positive status [ER+]: Secondary | ICD-10-CM | POA: Insufficient documentation

## 2017-11-01 DIAGNOSIS — C50412 Malignant neoplasm of upper-outer quadrant of left female breast: Secondary | ICD-10-CM | POA: Diagnosis not present

## 2017-11-01 MED ORDER — GADOBENATE DIMEGLUMINE 529 MG/ML IV SOLN
15.0000 mL | Freq: Once | INTRAVENOUS | Status: AC | PRN
  Administered 2017-11-01: 15 mL via INTRAVENOUS

## 2017-11-01 NOTE — Telephone Encounter (Signed)
Spoke to pt concerning [place] from 5.15.19. Denies questions or concerns regarding dx or treatment care plan. Encourage pt to call with needs. Received verbal understanding. Contact information provided.

## 2017-11-02 ENCOUNTER — Telehealth: Payer: Self-pay | Admitting: *Deleted

## 2017-11-02 NOTE — Telephone Encounter (Signed)
Called pt and discuss breast MRI results. Pt denies further questions or needs. Encourage pt to call is either should arise. Received verbal understanding.

## 2017-11-03 ENCOUNTER — Encounter: Payer: Self-pay | Admitting: General Practice

## 2017-11-03 NOTE — Progress Notes (Signed)
[place] Psychosocial Distress Screening [place] by phone following Breast Multidisciplinary Clinic to introduce [place] team/resources, reviewing distress screen per protocol.  The patient scored a 6 on the Psychosocial Distress Thermometer which indicates moderate distress. Also assessed for distress and other psychosocial needs.   ON[facility] DISTRESS SCREENING 11/03/2017  Screening Type Initial Screening  Distress experienced in past week (1-10) 6  Emotional problem type Nervousness/Anxiety;Adjusting to illness  Information Concerns Type Lack of info about diagnosis;Lack of info about treatment  Physical Problem type Sleep/insomnia  Referral to support programs Yes    Beth [name] reports feeling much better since [facility] helped resolve the unknown. She reports strong support from friends and neighbors, which has helped her cope with the natural ups and downs of dx. Per pt, her distress is much lower now, and though she "dread[s] radiation," she is optimistic about how it will help her.  Follow up needed: No. Per pt, no other needs at this time, but she took my number and plans to call as needed.   [place], [place], [facility] Pager [phone] Voicemail [phone]

## 2017-11-08 DIAGNOSIS — C50412 Malignant neoplasm of upper-outer quadrant of left female breast: Secondary | ICD-10-CM | POA: Diagnosis not present

## 2017-11-08 DIAGNOSIS — Z9889 Other specified postprocedural states: Secondary | ICD-10-CM | POA: Diagnosis not present

## 2017-11-08 DIAGNOSIS — I10 Essential (primary) hypertension: Secondary | ICD-10-CM | POA: Diagnosis not present

## 2017-11-08 DIAGNOSIS — Z803 Family history of malignant neoplasm of breast: Secondary | ICD-10-CM | POA: Diagnosis not present

## 2017-11-09 ENCOUNTER — Telehealth: Payer: Self-pay | Admitting: *Deleted

## 2017-11-09 MED ORDER — LETROZOLE 2.5 MG PO TABS: 2.5000 mg | ORAL_TABLET | Freq: Every day | ORAL | 6 refills | Status: DC

## 2017-11-09 NOTE — Telephone Encounter (Signed)
Called pt to discuss recommendations from Dr. Lindi Adie to start AI. Gave instructions and directions. Pt denies further needs. Informed she is going to have a mastectomy with reconstruction. Informed pt to stop Letrozole 5 days prior to sx. Received verbal understanding. Encourage pt to call with further needs or questions.

## 2017-11-14 ENCOUNTER — Other Ambulatory Visit: Payer: Self-pay | Admitting: General Surgery

## 2017-11-14 DIAGNOSIS — C50912 Malignant neoplasm of unspecified site of left female breast: Secondary | ICD-10-CM

## 2017-11-14 DIAGNOSIS — C50412 Malignant neoplasm of upper-outer quadrant of left female breast: Secondary | ICD-10-CM | POA: Diagnosis not present

## 2017-11-14 DIAGNOSIS — Z170 Estrogen receptor positive status [ER+]: Secondary | ICD-10-CM | POA: Diagnosis not present

## 2017-11-27 ENCOUNTER — Telehealth: Payer: Self-pay | Admitting: Hematology and Oncology

## 2017-11-27 NOTE — Telephone Encounter (Signed)
Mailed calendar of upcoming July appointments per 6/14 sch message

## 2017-12-01 NOTE — Pre-Procedure Instructions (Signed)
NEKEISHA AURE  12/01/2017      [place] [place]. [place] [address] Phone: [phone] Fax: [phone]    Your procedure is scheduled on July 1  Report to [place] at 0730 A.M.  Call this number if you have problems the morning of surgery:  [phone]   Remember:  NOTHING TO EAT OR DRINK AFTER MIDNIGHT    Take these medicines the morning of surgery with A SIP OF WATER  EYE DROPS IF NEEDED letrozole [facility])   7 days prior to surgery STOP taking any Aspirin(unless otherwise instructed by your surgeon), Aleve, Naproxen, Ibuprofen, Motrin, Advil, Goody's, BC's, all herbal medications, fish oil, and all vitamins     Do not wear jewelry, make-up or nail polish.  Do not wear lotions, powders, or perfumes, or deodorant.  Do not shave 48 hours prior to surgery.    Do not bring valuables to the hospital.  [facility] is not responsible for any belongings or valuables.  Contacts, dentures or bridgework may not be worn into surgery.  Leave your suitcase in the car.  After surgery it may be brought to your room.  For patients admitted to the hospital, discharge time will be determined by your treatment team.  Patients discharged the day of surgery will not be allowed to drive home.    Special instructions:   - Preparing For Surgery  Before surgery, you can play an important role. Because skin is not sterile, your skin needs to be as free of germs as possible. You can reduce the number of germs on your skin by washing with CHG (chlorahexidine gluconate) Soap before surgery.  CHG is an antiseptic cleaner which kills germs and bonds with the skin to continue killing germs even after washing.    Oral Hygiene is also important to reduce your risk of infection.  Remember - BRUSH YOUR TEETH THE MORNING OF SURGERY WITH YOUR REGULAR TOOTHPASTE  Please do not use if you have an  allergy to CHG or antibacterial soaps. If your skin becomes reddened/irritated stop using the CHG.  Do not shave (including legs and underarms) for at least 48 hours prior to first CHG shower. It is OK to shave your face.  Please follow these instructions carefully.   1. Shower the NIGHT BEFORE SURGERY and the MORNING OF SURGERY with CHG.   2. If you chose to wash your hair, wash your hair first as usual with your normal shampoo.  3. After you shampoo, rinse your hair and body thoroughly to remove the shampoo.  4. Use CHG as you would any other liquid soap. You can apply CHG directly to the skin and wash gently with a scrungie or a clean washcloth.   5. Apply the CHG Soap to your body ONLY FROM THE NECK DOWN.  Do not use on open wounds or open sores. Avoid contact with your eyes, ears, mouth and genitals (private parts). Wash Face and genitals (private parts)  with your normal soap.  6. Wash thoroughly, paying special attention to the area where your surgery will be performed.  7. Thoroughly rinse your body with warm water from the neck down.  8. DO NOT shower/wash with your normal soap after using and rinsing off the CHG Soap.  9. Pat yourself dry with a CLEAN TOWEL.  10. Wear CLEAN PAJAMAS to bed the night before surgery, wear comfortable clothes the  morning of surgery  11. Place CLEAN SHEETS on your bed the night of your first shower and DO NOT SLEEP WITH PETS.    Day of Surgery:  Do not apply any deodorants/lotions.  Please wear clean clothes to the hospital/surgery center.   Remember to brush your teeth WITH YOUR REGULAR TOOTHPASTE.    Please read over the following fact sheets that you were given.

## 2017-12-04 ENCOUNTER — Other Ambulatory Visit (HOSPITAL_COMMUNITY): Payer: Medicare Other

## 2017-12-04 ENCOUNTER — Encounter (HOSPITAL_COMMUNITY)
Admission: RE | Admit: 2017-12-04 | Discharge: 2017-12-04 | Disposition: A | Discharge status: Home / Self Care | Payer: Medicare Other | Source: Ambulatory Visit | Attending: General Surgery | Admitting: General Surgery

## 2017-12-04 ENCOUNTER — Other Ambulatory Visit: Payer: Self-pay

## 2017-12-04 ENCOUNTER — Encounter (HOSPITAL_COMMUNITY): Payer: Self-pay

## 2017-12-04 DIAGNOSIS — I10 Essential (primary) hypertension: Secondary | ICD-10-CM | POA: Diagnosis not present

## 2017-12-04 DIAGNOSIS — Z01818 Encounter for other preprocedural examination: Secondary | ICD-10-CM | POA: Insufficient documentation

## 2017-12-04 DIAGNOSIS — C50912 Malignant neoplasm of unspecified site of left female breast: Secondary | ICD-10-CM | POA: Insufficient documentation

## 2017-12-04 DIAGNOSIS — R9431 Abnormal electrocardiogram [ECG] [EKG]: Secondary | ICD-10-CM | POA: Insufficient documentation

## 2017-12-04 HISTORY — DX: Unspecified osteoarthritis, unspecified site: M19.90

## 2017-12-04 LAB — COMPREHENSIVE METABOLIC PANEL
ALT: 14 U/L (ref 14–54)
ANION GAP: 12 (ref 5–15)
AST: 20 U/L (ref 15–41)
Albumin: 4.1 g/dL (ref 3.5–5.0)
Alkaline Phosphatase: 70 U/L (ref 38–126)
BUN: 28 mg/dL — ABNORMAL HIGH (ref 6–20)
CHLORIDE: 100 mmol/L — AB (ref 101–111)
CO2: 24 mmol/L (ref 22–32)
CREATININE: 1.53 mg/dL — AB (ref 0.44–1.00)
Calcium: 9.1 mg/dL (ref 8.9–10.3)
GFR calc non Af Amer: 31 mL/min — ABNORMAL LOW (ref >60)
GFR, EST AFRICAN AMERICAN: 36 mL/min — AB (ref >60)
Glucose, Bld: 113 mg/dL — ABNORMAL HIGH (ref 65–99)
POTASSIUM: 4.1 mmol/L (ref 3.5–5.1)
Sodium: 136 mmol/L (ref 135–145)
Total Bilirubin: 0.6 mg/dL (ref 0.3–1.2)
Total Protein: 6.8 g/dL (ref 6.5–8.1)

## 2017-12-04 LAB — CBC WITH DIFFERENTIAL/PLATELET
ABS IMMATURE GRANULOCYTES: 0 10*3/uL (ref 0.0–0.1)
BASOS ABS: 0.1 10*3/uL (ref 0.0–0.1)
BASOS PCT: 1 %
EOS ABS: 0.2 10*3/uL (ref 0.0–0.7)
Eosinophils Relative: 3 %
HCT: 35 % — ABNORMAL LOW (ref 36.0–46.0)
Hemoglobin: 11.8 g/dL — ABNORMAL LOW (ref 12.0–15.0)
Immature Granulocytes: 1 %
Lymphocytes Relative: 28 %
Lymphs Abs: 2.3 10*3/uL (ref 0.7–4.0)
MCH: 31.2 pg (ref 26.0–34.0)
MCHC: 33.7 g/dL (ref 30.0–36.0)
MCV: 92.6 fL (ref 78.0–100.0)
MONO ABS: 0.7 10*3/uL (ref 0.1–1.0)
MONOS PCT: 8 %
NEUTROS ABS: 5 10*3/uL (ref 1.7–7.7)
Neutrophils Relative %: 59 %
PLATELETS: 252 10*3/uL (ref 150–400)
RBC: 3.78 MIL/uL — ABNORMAL LOW (ref 3.87–5.11)
RDW: 11.6 % (ref 11.5–15.5)
WBC: 8.3 10*3/uL (ref 4.0–10.5)

## 2017-12-04 MED ORDER — CHLORHEXIDINE GLUCONATE CLOTH 2 % EX PADS: 6.0000 | MEDICATED_PAD | Freq: Once | CUTANEOUS | Status: DC

## 2017-12-07 DIAGNOSIS — H5203 Hypermetropia, bilateral: Secondary | ICD-10-CM | POA: Diagnosis not present

## 2017-12-07 DIAGNOSIS — H2513 Age-related nuclear cataract, bilateral: Secondary | ICD-10-CM | POA: Diagnosis not present

## 2017-12-07 DIAGNOSIS — H353132 Nonexudative age-related macular degeneration, bilateral, intermediate dry stage: Secondary | ICD-10-CM | POA: Diagnosis not present

## 2017-12-07 DIAGNOSIS — H524 Presbyopia: Secondary | ICD-10-CM | POA: Diagnosis not present

## 2017-12-09 NOTE — H&P (Signed)
Beth [name] Location: [facility] Patient #: [id number] DOB: October 11, 1940 Widowed / Language: Beth [name] / Race: White Female        History of Present Illness        The patient is a 77 year old female who presents with breast cancer. This is a very pleasant 33 year old woman who is here with one of her friends to discuss surgical management of her 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 Dr. Reynaldo [name] is her PCP. She was recently evaluated in the [facility] by Dr. Lindi [name], Dr. Isidore [name], and me.      She recently felt a lump in her left breast. Screening mammograms found a 2.8 cm mass in the left breast at the 3 o'clock position 5 cm from the nipple. Ultrasound of the axilla was negative. Image guided biopsy showed grade 2 invasive lobular carcinoma, receptor strongly positive, Ki-67 2%, HER-2 negative On exam the tumor felt much larger MRI shows a solitary tumor in the lateral breast at least 4.5 cm. No other lesions in either breast. Lymph nodes looked normal.      Past history significant for bilateral lumpectomy for benign disease. Hypertension. Cholecystectomy by Dr. Delford [name] Family history reveals breast cancer in a maternal aunt. Colon cancer in a different maternal aunt. Mother died age 41 of old age. Father died of heart disease in his 71s. She is a widow but physically quite active. Denies tobacco. Exterior 4 glasses of wine a week when she is playing games socially with her friends. 2 adopted children. Lives in her own home and is independent. Very active lifestyle has lots of social activities      Examination revealed a large tumor in the lateral left breast, it feels 6 cm at least.. It comes close to the nipple and areolar complex but is not involving this. I told her that I was concerned about attempting lumpectomy with this because of its large size, significant cosmetic defect, and possibility of positive margins with lobular carcinoma. We had  a long talk about different options for surgical intervention and evaluation and she was to go ahead with the mastectomy. She does have some interest in reconstruction and wants to see a Psychiatric nurse       She will immediately be referred to plastic surgery to see if she is a candidate for immediate reconstruction From my standpoint she will need a left total mastectomy, injection of blue dye, and left axillary sentinel node biopsy I will ask her to come back and see me in 2 weeks unless she goes ahead and makes her decision after seeing the plastic surgery in which case we will expedite her surgery.   Addendum Note Patient has seen Dr. Iran [name] She has decided against any reconstruction She will be scheduled for left total mastectomy, deep sentinel lymph node biopsy She has been started on anti-estrogens by Dr. Lindi [name]    Allergies No Known Allergies  Allergies Reconciled   Medication History Lisinopril (20MG Tablet, Oral) Active. Omeprazole (20MG Capsule DR, Oral) Active. Medications Reconciled  Vitals  Weight: 154.4 lb Height: 63in Body Surface Area: 1.73 m Body Mass Index: 27.35 kg/m  Temp.: 98.6F(Oral)  Pulse: 98 (Regular)  BP: 110/72 (Sitting, Left Arm, Standard)       Physical Exam  General Mental Status-Alert. General Appearance-Not in acute distress. Build & Nutrition-Well nourished. Posture-Normal posture. Gait-Normal.  Head and Neck Head-normocephalic, atraumatic with no lesions or palpable masses. Trachea-midline. Thyroid Gland Characteristics - normal size and  consistency and no palpable nodules.  Chest and Lung Exam Chest and lung exam reveals -on auscultation, normal breath sounds, no adventitious sounds and normal vocal resonance.  Breast Note: 6 cm mobile mass left breast laterally. Does not involve skin within 1 cm nipple and areolar complex. This is not any smaller than it was 2 weeks ago. No axillary  adenopathy. Skin healthy.   Cardiovascular Cardiovascular examination reveals -normal heart sounds, regular rate and rhythm with no murmurs and femoral artery auscultation bilaterally reveals normal pulses, no bruits, no thrills.  Abdomen Inspection Inspection of the abdomen reveals - No Hernias. Palpation/Percussion Palpation and Percussion of the abdomen reveal - Soft, Non Tender, No Rigidity (guarding), No hepatosplenomegaly and No Palpable abdominal masses.  Neurologic Neurologic evaluation reveals -alert and oriented x 3 with no impairment of recent or remote memory, normal attention span and ability to concentrate, normal sensation and normal coordination.  Musculoskeletal Normal Exam - Bilateral-Upper Extremity Strength Normal and Lower Extremity Strength Normal.    Assessment & Plan  PRIMARY CANCER OF UPPER OUTER QUADRANT OF LEFT FEMALE BREAST (C50.412)   Your MRI shows the cancer in the left breast to be larger than the mammogram. At least 4.5 cm. The good news is that this is a solitary finding and that does not appear to be cancer elsewhere in either breast. All of the lymph nodes looked normal  Physical exam reveals that the tumor is at least 5 cm, and possibly larger A lumpectomy could be performed technically but the cosmetic defect would be very significant  We have talked about this for a long time and we have decided to proceed with left total mastectomy and sentinel lymph node biopsy you are interested in considering plastic surgical reconstruction you will be immediately referred to a plastic surgeon to discuss this  Once we know what you decide with the plastic surgeon we will go ahead and schedule you for a left total mastectomy, left axillary sentinel node biopsy with or without reconstruction. We will make an appointment for you to see me in approximately 2 weeks, but if we are clear about our decisions then we could go ahead and initiate surgical  scheduling after you see the plastic surgeon  FAMILY HISTORY OF BREAST CANCER (Z80.3) HISTORY OF BILATERAL BREAST BIOPSY (Z98.890) HYPERTENSION, BENIGN (I10)    Beth [name] M. Dalbert Batman, M.D., [facility] General and Minimally invasive Surgery Breast and Colorectal Surgery Office:   [phone] Pager:   [phone]

## 2017-12-11 ENCOUNTER — Ambulatory Visit (HOSPITAL_COMMUNITY): Payer: Medicare Other | Admitting: Emergency Medicine

## 2017-12-11 ENCOUNTER — Ambulatory Visit (HOSPITAL_COMMUNITY): Payer: Medicare Other | Admitting: Anesthesiology

## 2017-12-11 ENCOUNTER — Encounter (HOSPITAL_COMMUNITY)
Admission: RE | Disposition: A | Discharge status: Home / Self Care | Payer: Self-pay | Source: Ambulatory Visit | Attending: General Surgery

## 2017-12-11 ENCOUNTER — Ambulatory Visit (HOSPITAL_COMMUNITY)
Admission: RE | Admit: 2017-12-11 | Discharge: 2017-12-12 | Disposition: A | Discharge status: Home / Self Care | Payer: Medicare Other | Source: Ambulatory Visit | Attending: General Surgery | Admitting: General Surgery

## 2017-12-11 ENCOUNTER — Ambulatory Visit (HOSPITAL_COMMUNITY)
Admission: RE | Admit: 2017-12-11 | Discharge: 2017-12-11 | Disposition: A | Discharge status: Home / Self Care | Payer: Medicare Other | Source: Ambulatory Visit | Attending: General Surgery | Admitting: General Surgery

## 2017-12-11 ENCOUNTER — Encounter (HOSPITAL_COMMUNITY): Payer: Self-pay | Admitting: *Deleted

## 2017-12-11 ENCOUNTER — Other Ambulatory Visit: Payer: Self-pay

## 2017-12-11 DIAGNOSIS — Z803 Family history of malignant neoplasm of breast: Secondary | ICD-10-CM | POA: Insufficient documentation

## 2017-12-11 DIAGNOSIS — Z170 Estrogen receptor positive status [ER+]: Secondary | ICD-10-CM | POA: Diagnosis not present

## 2017-12-11 DIAGNOSIS — Z886 Allergy status to analgesic agent status: Secondary | ICD-10-CM | POA: Diagnosis not present

## 2017-12-11 DIAGNOSIS — K8010 Calculus of gallbladder with chronic cholecystitis without obstruction: Secondary | ICD-10-CM | POA: Diagnosis not present

## 2017-12-11 DIAGNOSIS — C50912 Malignant neoplasm of unspecified site of left female breast: Secondary | ICD-10-CM

## 2017-12-11 DIAGNOSIS — C50812 Malignant neoplasm of overlapping sites of left female breast: Secondary | ICD-10-CM | POA: Diagnosis not present

## 2017-12-11 DIAGNOSIS — C50412 Malignant neoplasm of upper-outer quadrant of left female breast: Secondary | ICD-10-CM | POA: Diagnosis present

## 2017-12-11 DIAGNOSIS — I10 Essential (primary) hypertension: Secondary | ICD-10-CM | POA: Insufficient documentation

## 2017-12-11 DIAGNOSIS — Z79899 Other long term (current) drug therapy: Secondary | ICD-10-CM | POA: Diagnosis not present

## 2017-12-11 DIAGNOSIS — G8918 Other acute postprocedural pain: Secondary | ICD-10-CM | POA: Diagnosis not present

## 2017-12-11 HISTORY — DX: Malignant neoplasm of unspecified site of left female breast: C50.912

## 2017-12-11 HISTORY — PX: MASTECTOMY COMPLETE / SIMPLE W/ SENTINEL NODE BIOPSY: SUR846

## 2017-12-11 HISTORY — PX: MASTECTOMY W/ SENTINEL NODE BIOPSY: SHX2001

## 2017-12-11 LAB — CREATININE, SERUM
CREATININE: 1.11 mg/dL — AB (ref 0.44–1.00)
GFR calc Af Amer: 54 mL/min — ABNORMAL LOW (ref >60)
GFR, EST NON AFRICAN AMERICAN: 46 mL/min — AB (ref >60)

## 2017-12-11 LAB — CBC
HEMATOCRIT: 32.6 % — AB (ref 36.0–46.0)
HEMOGLOBIN: 10.8 g/dL — AB (ref 12.0–15.0)
MCH: 31.1 pg (ref 26.0–34.0)
MCHC: 33.1 g/dL (ref 30.0–36.0)
MCV: 93.9 fL (ref 78.0–100.0)
Platelets: 186 10*3/uL (ref 150–400)
RBC: 3.47 MIL/uL — AB (ref 3.87–5.11)
RDW: 11.8 % (ref 11.5–15.5)
WBC: 11.4 10*3/uL — AB (ref 4.0–10.5)

## 2017-12-11 SURGERY — MASTECTOMY WITH SENTINEL LYMPH NODE BIOPSY
Anesthesia: General | Site: Breast | Laterality: Left

## 2017-12-11 MED ORDER — PHENYLEPHRINE HCL 10 MG/ML IJ SOLN
INTRAMUSCULAR | Status: DC | PRN
  Administered 2017-12-11: 50 ug/min via INTRAVENOUS

## 2017-12-11 MED ORDER — SODIUM CHLORIDE 0.9 % IJ SOLN
INTRAMUSCULAR | Status: AC
  Filled 2017-12-11: qty 10

## 2017-12-11 MED ORDER — CEFAZOLIN SODIUM-DEXTROSE 2-4 GM/100ML-% IV SOLN
2.0000 g | INTRAVENOUS | Status: AC
  Administered 2017-12-11: 2 g via INTRAVENOUS

## 2017-12-11 MED ORDER — OCUVITE-LUTEIN PO CAPS
1.0000 | ORAL_CAPSULE | Freq: Every day | ORAL | Status: DC
  Filled 2017-12-11: qty 1

## 2017-12-11 MED ORDER — ONDANSETRON HCL 4 MG/2ML IJ SOLN
INTRAMUSCULAR | Status: AC
  Filled 2017-12-11: qty 4

## 2017-12-11 MED ORDER — METHOCARBAMOL 500 MG PO TABS
ORAL_TABLET | ORAL | Status: AC
  Administered 2017-12-11: 500 mg via ORAL
  Filled 2017-12-11: qty 1

## 2017-12-11 MED ORDER — HYDROCODONE-ACETAMINOPHEN 5-325 MG PO TABS
ORAL_TABLET | ORAL | Status: AC
  Administered 2017-12-11: 1 via ORAL
  Filled 2017-12-11: qty 1

## 2017-12-11 MED ORDER — POTASSIUM 99 MG PO TABS: 99.0000 mg | ORAL_TABLET | Freq: Every day | ORAL | Status: DC

## 2017-12-11 MED ORDER — GABAPENTIN 300 MG PO CAPS
300.0000 mg | ORAL_CAPSULE | ORAL | Status: AC
  Administered 2017-12-11: 300 mg via ORAL

## 2017-12-11 MED ORDER — VITAMIN C 500 MG PO TABS: 1000.0000 mg | ORAL_TABLET | Freq: Every day | ORAL | Status: DC

## 2017-12-11 MED ORDER — SUGAMMADEX SODIUM 200 MG/2ML IV SOLN
INTRAVENOUS | Status: AC
  Filled 2017-12-11: qty 4

## 2017-12-11 MED ORDER — FENTANYL CITRATE (PF) 100 MCG/2ML IJ SOLN
100.0000 ug | Freq: Once | INTRAMUSCULAR | Status: AC
  Administered 2017-12-11: 100 ug via INTRAVENOUS

## 2017-12-11 MED ORDER — EPHEDRINE SULFATE 50 MG/ML IJ SOLN
INTRAMUSCULAR | Status: AC
  Filled 2017-12-11: qty 1

## 2017-12-11 MED ORDER — SODIUM CHLORIDE 0.9 % IJ SOLN
INTRAVENOUS | Status: DC | PRN
  Administered 2017-12-11: 5 mL via SUBCUTANEOUS

## 2017-12-11 MED ORDER — GABAPENTIN 300 MG PO CAPS
ORAL_CAPSULE | ORAL | Status: AC
  Administered 2017-12-11: 300 mg via ORAL
  Filled 2017-12-11: qty 1

## 2017-12-11 MED ORDER — LACTATED RINGERS IV SOLN
INTRAVENOUS | Status: DC
  Administered 2017-12-11: 20:00:00 via INTRAVENOUS
  Administered 2017-12-11: 100 mL/h via INTRAVENOUS
  Administered 2017-12-12: 06:00:00 via INTRAVENOUS

## 2017-12-11 MED ORDER — METHYLENE BLUE 0.5 % INJ SOLN
INTRAVENOUS | Status: AC
  Filled 2017-12-11: qty 10

## 2017-12-11 MED ORDER — LIDOCAINE HCL (CARDIAC) PF 100 MG/5ML IV SOSY
PREFILLED_SYRINGE | INTRAVENOUS | Status: DC | PRN
  Administered 2017-12-11: 50 mg via INTRAVENOUS

## 2017-12-11 MED ORDER — LETROZOLE 2.5 MG PO TABS
2.5000 mg | ORAL_TABLET | Freq: Every day | ORAL | Status: DC
  Administered 2017-12-12: 2.5 mg via ORAL
  Filled 2017-12-11: qty 1

## 2017-12-11 MED ORDER — SUCCINYLCHOLINE CHLORIDE 20 MG/ML IJ SOLN
INTRAMUSCULAR | Status: DC | PRN
  Administered 2017-12-11: 100 mg via INTRAVENOUS

## 2017-12-11 MED ORDER — 0.9 % SODIUM CHLORIDE (POUR BTL) OPTIME
TOPICAL | Status: DC | PRN
  Administered 2017-12-11 (×2): 1000 mL

## 2017-12-11 MED ORDER — HYDROCODONE-ACETAMINOPHEN 5-325 MG PO TABS
1.0000 | ORAL_TABLET | ORAL | Status: DC | PRN
  Administered 2017-12-11: 1 via ORAL

## 2017-12-11 MED ORDER — DEXAMETHASONE SODIUM PHOSPHATE 10 MG/ML IJ SOLN
INTRAMUSCULAR | Status: DC | PRN
  Administered 2017-12-11: 10 mg via INTRAVENOUS

## 2017-12-11 MED ORDER — LISINOPRIL-HYDROCHLOROTHIAZIDE 20-12.5 MG PO TABS: 1.0000 | ORAL_TABLET | Freq: Every day | ORAL | Status: DC

## 2017-12-11 MED ORDER — FENTANYL CITRATE (PF) 250 MCG/5ML IJ SOLN
INTRAMUSCULAR | Status: AC
  Filled 2017-12-11: qty 5

## 2017-12-11 MED ORDER — HYDROMORPHONE HCL 1 MG/ML IJ SOLN: 0.5000 mg | INTRAMUSCULAR | Status: DC | PRN

## 2017-12-11 MED ORDER — TRAMADOL HCL 50 MG PO TABS: 50.0000 mg | ORAL_TABLET | Freq: Four times a day (QID) | ORAL | Status: DC | PRN

## 2017-12-11 MED ORDER — MIDAZOLAM HCL 2 MG/2ML IJ SOLN
2.0000 mg | Freq: Once | INTRAMUSCULAR | Status: AC
  Administered 2017-12-11: 2 mg via INTRAVENOUS

## 2017-12-11 MED ORDER — ENOXAPARIN SODIUM 40 MG/0.4ML ~~LOC~~ SOLN
40.0000 mg | SUBCUTANEOUS | Status: DC
  Administered 2017-12-12: 40 mg via SUBCUTANEOUS
  Filled 2017-12-11: qty 0.4

## 2017-12-11 MED ORDER — ENOXAPARIN SODIUM 30 MG/0.3ML ~~LOC~~ SOLN: 30.0000 mg | SUBCUTANEOUS | Status: DC

## 2017-12-11 MED ORDER — ONDANSETRON HCL 4 MG/2ML IJ SOLN: 4.0000 mg | Freq: Four times a day (QID) | INTRAMUSCULAR | Status: DC | PRN

## 2017-12-11 MED ORDER — LIDOCAINE 2% (20 MG/ML) 5 ML SYRINGE
INTRAMUSCULAR | Status: AC
  Filled 2017-12-11: qty 5

## 2017-12-11 MED ORDER — TECHNETIUM TC 99M SULFUR COLLOID FILTERED
1.0000 | Freq: Once | INTRAVENOUS | Status: AC | PRN
  Administered 2017-12-11: 1 via INTRADERMAL

## 2017-12-11 MED ORDER — DEXAMETHASONE SODIUM PHOSPHATE 10 MG/ML IJ SOLN
INTRAMUSCULAR | Status: AC
  Filled 2017-12-11: qty 2

## 2017-12-11 MED ORDER — LACTATED RINGERS IV SOLN
INTRAVENOUS | Status: DC
  Administered 2017-12-11: 08:00:00 via INTRAVENOUS

## 2017-12-11 MED ORDER — FENTANYL CITRATE (PF) 100 MCG/2ML IJ SOLN
INTRAMUSCULAR | Status: AC
  Administered 2017-12-11: 100 ug via INTRAVENOUS
  Filled 2017-12-11: qty 2

## 2017-12-11 MED ORDER — BIOTIN 5000 MCG PO TABS: 5000.0000 ug | ORAL_TABLET | Freq: Every day | ORAL | Status: DC

## 2017-12-11 MED ORDER — PROPOFOL 10 MG/ML IV BOLUS
INTRAVENOUS | Status: DC | PRN
  Administered 2017-12-11: 150 mg via INTRAVENOUS

## 2017-12-11 MED ORDER — SENNA 8.6 MG PO TABS
1.0000 | ORAL_TABLET | Freq: Two times a day (BID) | ORAL | Status: DC
  Administered 2017-12-11 – 2017-12-12 (×3): 8.6 mg via ORAL
  Filled 2017-12-11 (×3): qty 1

## 2017-12-11 MED ORDER — VITAMIN D 1000 UNITS PO TABS: 1000.0000 [IU] | ORAL_TABLET | Freq: Every day | ORAL | Status: DC

## 2017-12-11 MED ORDER — ACETAMINOPHEN 500 MG PO TABS
1000.0000 mg | ORAL_TABLET | ORAL | Status: AC
  Administered 2017-12-11: 1000 mg via ORAL

## 2017-12-11 MED ORDER — FENTANYL CITRATE (PF) 100 MCG/2ML IJ SOLN
25.0000 ug | INTRAMUSCULAR | Status: DC | PRN
  Administered 2017-12-11 (×2): 25 ug via INTRAVENOUS

## 2017-12-11 MED ORDER — FENTANYL CITRATE (PF) 100 MCG/2ML IJ SOLN
INTRAMUSCULAR | Status: DC | PRN
  Administered 2017-12-11: 50 ug via INTRAVENOUS
  Administered 2017-12-11: 100 ug via INTRAVENOUS

## 2017-12-11 MED ORDER — CEFAZOLIN SODIUM-DEXTROSE 2-4 GM/100ML-% IV SOLN
2.0000 g | Freq: Three times a day (TID) | INTRAVENOUS | Status: AC
  Administered 2017-12-11: 2 g via INTRAVENOUS
  Filled 2017-12-11: qty 100

## 2017-12-11 MED ORDER — VITAMIN B-12 1000 MCG PO TABS: 2500.0000 ug | ORAL_TABLET | Freq: Every day | ORAL | Status: DC

## 2017-12-11 MED ORDER — ACETAMINOPHEN 500 MG PO TABS
ORAL_TABLET | ORAL | Status: AC
  Administered 2017-12-11: 1000 mg via ORAL
  Filled 2017-12-11: qty 2

## 2017-12-11 MED ORDER — HYDROCHLOROTHIAZIDE 12.5 MG PO CAPS
12.5000 mg | ORAL_CAPSULE | Freq: Every day | ORAL | Status: DC
Start: 2017-12-12 — End: 2017-12-12
  Administered 2017-12-12: 12.5 mg via ORAL
  Filled 2017-12-11: qty 1

## 2017-12-11 MED ORDER — CELECOXIB 200 MG PO CAPS
200.0000 mg | ORAL_CAPSULE | Freq: Two times a day (BID) | ORAL | Status: DC
  Administered 2017-12-11 – 2017-12-12 (×3): 200 mg via ORAL
  Filled 2017-12-11 (×3): qty 1

## 2017-12-11 MED ORDER — SUGAMMADEX SODIUM 200 MG/2ML IV SOLN
INTRAVENOUS | Status: DC | PRN
  Administered 2017-12-11: 300 mg via INTRAVENOUS

## 2017-12-11 MED ORDER — ROCURONIUM BROMIDE 10 MG/ML (PF) SYRINGE
PREFILLED_SYRINGE | INTRAVENOUS | Status: AC
  Filled 2017-12-11: qty 10

## 2017-12-11 MED ORDER — LISINOPRIL 20 MG PO TABS
20.0000 mg | ORAL_TABLET | Freq: Every day | ORAL | Status: DC
  Administered 2017-12-12: 20 mg via ORAL
  Filled 2017-12-11: qty 1

## 2017-12-11 MED ORDER — POLYVINYL ALCOHOL 1.4 % OP SOLN
1.0000 [drp] | Freq: Three times a day (TID) | OPHTHALMIC | Status: DC | PRN
  Filled 2017-12-11: qty 15

## 2017-12-11 MED ORDER — ROCURONIUM BROMIDE 100 MG/10ML IV SOLN
INTRAVENOUS | Status: DC | PRN
  Administered 2017-12-11: 30 mg via INTRAVENOUS

## 2017-12-11 MED ORDER — GABAPENTIN 300 MG PO CAPS
300.0000 mg | ORAL_CAPSULE | Freq: Two times a day (BID) | ORAL | Status: DC
  Administered 2017-12-11 – 2017-12-12 (×3): 300 mg via ORAL
  Filled 2017-12-11 (×3): qty 1

## 2017-12-11 MED ORDER — CEFAZOLIN SODIUM-DEXTROSE 2-4 GM/100ML-% IV SOLN
INTRAVENOUS | Status: AC
  Filled 2017-12-11: qty 100

## 2017-12-11 MED ORDER — PROPOFOL 10 MG/ML IV BOLUS
INTRAVENOUS | Status: AC
  Filled 2017-12-11: qty 20

## 2017-12-11 MED ORDER — ONDANSETRON 4 MG PO TBDP: 4.0000 mg | ORAL_TABLET | Freq: Four times a day (QID) | ORAL | Status: DC | PRN

## 2017-12-11 MED ORDER — LACTATED RINGERS IV SOLN
INTRAVENOUS | Status: DC | PRN
  Administered 2017-12-11 (×2): via INTRAVENOUS

## 2017-12-11 MED ORDER — METHOCARBAMOL 500 MG PO TABS
500.0000 mg | ORAL_TABLET | Freq: Four times a day (QID) | ORAL | Status: DC | PRN
  Administered 2017-12-11: 500 mg via ORAL

## 2017-12-11 MED ORDER — MIDAZOLAM HCL 2 MG/2ML IJ SOLN
INTRAMUSCULAR | Status: AC
  Administered 2017-12-11: 2 mg via INTRAVENOUS
  Filled 2017-12-11: qty 2

## 2017-12-11 MED ORDER — FOLIC ACID 0.5 MG HALF TAB
500.0000 ug | ORAL_TABLET | Freq: Every day | ORAL | Status: DC
  Filled 2017-12-11: qty 1

## 2017-12-11 MED ORDER — ONDANSETRON HCL 4 MG/2ML IJ SOLN
INTRAMUSCULAR | Status: DC | PRN
  Administered 2017-12-11: 4 mg via INTRAVENOUS

## 2017-12-11 MED ORDER — FENTANYL CITRATE (PF) 100 MCG/2ML IJ SOLN
INTRAMUSCULAR | Status: AC
  Administered 2017-12-11: 25 ug via INTRAVENOUS
  Filled 2017-12-11: qty 2

## 2017-12-11 MED ORDER — GLUCOSAMINE-CHONDROITIN 500-400 MG PO CAPS: ORAL_CAPSULE | Freq: Every day | ORAL | Status: DC

## 2017-12-11 SURGICAL SUPPLY — 49 items
APPLIER CLIP 9.375 MED OPEN (MISCELLANEOUS) ×3
BINDER BREAST LRG (GAUZE/BANDAGES/DRESSINGS) IMPLANT
BINDER BREAST XLRG (GAUZE/BANDAGES/DRESSINGS) ×3 IMPLANT
BLADE SURG 15 STRL LF DISP TIS (BLADE) ×1 IMPLANT
BLADE SURG 15 STRL SS (BLADE) ×2
CANISTER SUCT 3000ML PPV (MISCELLANEOUS) ×3 IMPLANT
CHLORAPREP W/TINT 26ML (MISCELLANEOUS) ×3 IMPLANT
CLIP APPLIE 9.375 MED OPEN (MISCELLANEOUS) ×1 IMPLANT
CONT SPEC 4OZ CLIKSEAL STRL BL (MISCELLANEOUS) ×3 IMPLANT
COVER PROBE W GEL 5X96 (DRAPES) ×3 IMPLANT
COVER SURGICAL LIGHT HANDLE (MISCELLANEOUS) ×3 IMPLANT
DERMABOND ADVANCED (GAUZE/BANDAGES/DRESSINGS) ×2
DERMABOND ADVANCED .7 DNX12 (GAUZE/BANDAGES/DRESSINGS) ×1 IMPLANT
DRAIN CHANNEL 19F RND (DRAIN) ×6 IMPLANT
DRAPE CHEST BREAST 15X10 FENES (DRAPES) ×3 IMPLANT
DRAPE HALF SHEET 40X57 (DRAPES) ×3 IMPLANT
ELECT BLADE 4.0 EZ CLEAN MEGAD (MISCELLANEOUS) ×3
ELECT CAUTERY BLADE 6.4 (BLADE) ×3 IMPLANT
ELECT REM PT RETURN 9FT ADLT (ELECTROSURGICAL) ×3
ELECTRODE BLDE 4.0 EZ CLN MEGD (MISCELLANEOUS) ×1 IMPLANT
ELECTRODE REM PT RTRN 9FT ADLT (ELECTROSURGICAL) ×1 IMPLANT
EVACUATOR SILICONE 100CC (DRAIN) ×6 IMPLANT
GLOVE BIO SURGEON STRL SZ7.5 (GLOVE) ×3 IMPLANT
GLOVE BIOGEL PI IND STRL 7.5 (GLOVE) ×2 IMPLANT
GLOVE BIOGEL PI INDICATOR 7.5 (GLOVE) ×4
GLOVE EUDERMIC 7 POWDERFREE (GLOVE) ×3 IMPLANT
GOWN STRL REUS W/ TWL LRG LVL3 (GOWN DISPOSABLE) ×2 IMPLANT
GOWN STRL REUS W/ TWL XL LVL3 (GOWN DISPOSABLE) ×1 IMPLANT
GOWN STRL REUS W/TWL LRG LVL3 (GOWN DISPOSABLE) ×4
GOWN STRL REUS W/TWL XL LVL3 (GOWN DISPOSABLE) ×2
KIT BASIN OR (CUSTOM PROCEDURE TRAY) ×3 IMPLANT
KIT TURNOVER KIT B (KITS) ×3 IMPLANT
NEEDLE 18GX1X1/2 (RX/OR ONLY) (NEEDLE) ×3 IMPLANT
NEEDLE FILTER BLUNT 18X 1/2SAF (NEEDLE) ×2
NEEDLE FILTER BLUNT 18X1 1/2 (NEEDLE) ×1 IMPLANT
NEEDLE HYPO 25GX1X1/2 BEV (NEEDLE) ×3 IMPLANT
NS IRRIG 1000ML POUR BTL (IV SOLUTION) ×3 IMPLANT
PACK GENERAL/GYN (CUSTOM PROCEDURE TRAY) ×3 IMPLANT
PAD ARMBOARD 7.5X6 YLW CONV (MISCELLANEOUS) ×3 IMPLANT
SPECIMEN JAR X LARGE (MISCELLANEOUS) ×3 IMPLANT
SPONGE LAP 18X18 X RAY DECT (DISPOSABLE) ×3 IMPLANT
SUT ETHILON 3 0 FSL (SUTURE) ×6 IMPLANT
SUT MNCRL AB 4-0 PS2 18 (SUTURE) ×3 IMPLANT
SUT SILK 2 0 PERMA HAND 18 BK (SUTURE) ×3 IMPLANT
SUT VIC AB 3-0 SH 18 (SUTURE) ×6 IMPLANT
SUT VICRYL AB 2 0 TIES (SUTURE) ×3 IMPLANT
SYR CONTROL 10ML LL (SYRINGE) ×3 IMPLANT
TOWEL OR 17X24 6PK STRL BLUE (TOWEL DISPOSABLE) ×3 IMPLANT
TOWEL OR 17X26 10 PK STRL BLUE (TOWEL DISPOSABLE) ×3 IMPLANT

## 2017-12-11 NOTE — Progress Notes (Signed)
PHARMACIST - PHYSICIAN ORDER COMMUNICATION  CONCERNING: P&T Medication Policy on Herbal Medications  DESCRIPTION:  This patient's orders for:  Biotin, potassium 99, glucosamine/chondroitin have been noted.  These products are classified as an "herbal" or natural product. Due to a lack of definitive safety studies or FDA approval, nonstandard manufacturing practices, plus the potential risk of unknown drug-drug interactions while on inpatient medications, the Pharmacy and Therapeutics Committee does not permit the use of "herbal" or natural products of this type within [facility].   ACTION TAKEN: The pharmacy department is unable to verify this order at this time. Please reevaluate patient's clinical condition at discharge and address if the herbal or natural product(s) should be resumed at that time.

## 2017-12-11 NOTE — Anesthesia Procedure Notes (Signed)
Procedure Name: Intubation Date/Time: 12/11/2017 9:36 AM Performed by: Neldon Newport, CRNA Pre-anesthesia Checklist: Timeout performed, Patient being monitored, Suction available, Emergency Drugs available and Patient identified Patient Re-evaluated:Patient Re-evaluated prior to induction Oxygen Delivery Method: Circle system utilized Preoxygenation: Pre-oxygenation with 100% oxygen Induction Type: IV induction and Rapid sequence Ventilation: Mask ventilation without difficulty Laryngoscope Size: Mac and 3 Grade View: Grade I Tube type: Oral Tube size: 7.0 mm Number of attempts: 1 Placement Confirmation: breath sounds checked- equal and bilateral,  ETT inserted through vocal cords under direct vision and positive ETCO2 Secured at: 21 cm Tube secured with: Tape Dental Injury: Teeth and Oropharynx as per pre-operative assessment

## 2017-12-11 NOTE — Anesthesia Postprocedure Evaluation (Signed)
Anesthesia Post Note  Patient: Beth [name]  Procedure(s) Performed: INJECT BLUE DYE LEFT BREAST, LEFT TOTAL MASTECTOMY WITH WITH LEFT AXILLARY DEEP SENTINEL LYMPH NODE BIOPSY (Left Breast)     Patient location during evaluation: PACU Anesthesia Type: General Level of consciousness: awake Pain management: pain level controlled Vital Signs Assessment: post-procedure vital signs reviewed and stable Respiratory status: spontaneous breathing Cardiovascular status: stable Anesthetic complications: no    Last Vitals:  Vitals:   12/11/17 1140 12/11/17 1155  BP: 136/66 (!) 147/78  Pulse: 69 71  Resp: 14 14  Temp:    SpO2: 98% 100%    Last Pain:  Vitals:   12/11/17 1205  TempSrc:   PainSc: 5                  [name]

## 2017-12-11 NOTE — Anesthesia Procedure Notes (Signed)
Anesthesia Regional Block: Pectoralis block   Pre-Anesthetic Checklist: ,, timeout performed, Correct Patient, Correct Site, Correct Laterality, Correct Procedure, Correct Position, site marked, Risks and benefits discussed,  Surgical consent,  Pre-op evaluation,  At surgeon's request and post-op pain management  Laterality: Left  Prep: chloraprep       Needles:  Injection technique: Single-shot  Needle Type: Stimulator Needle - 40          Additional Needles:   Procedures: Doppler guided,,,, ultrasound used (permanent image in chart),,,,   Nerve Stimulator or Paresthesia:  Response: 0.5 mA,   Additional Responses:   Narrative:  Start time: 12/11/2017 8:50 AM End time: 12/11/2017 9:05 AM Injection made incrementally with aspirations every 5 mL.  Performed by: Personally  Anesthesiologist: Belinda Block, MD

## 2017-12-11 NOTE — Transfer of Care (Signed)
Immediate Anesthesia Transfer of Care Note  Patient: Beth [name]  Procedure(s) Performed: Reyne Dumas BLUE DYE LEFT BREAST, LEFT TOTAL MASTECTOMY WITH WITH LEFT AXILLARY DEEP SENTINEL LYMPH NODE BIOPSY (Left Breast)  Patient Location: PACU  Anesthesia Type:General  Level of Consciousness: awake, alert  and oriented  Airway & Oxygen Therapy: Patient Spontanous Breathing and Patient connected to nasal cannula oxygen  Post-op Assessment: Report given to RN, Post -op Vital signs reviewed and stable and Patient moving all extremities X 4  Post vital signs: Reviewed and stable  Last Vitals:  Vitals Value Taken Time  BP    Temp    Pulse 78 12/11/2017 11:37 AM  Resp 15 12/11/2017 11:37 AM  SpO2 98 % 12/11/2017 11:37 AM  Vitals shown include unvalidated device data.  Last Pain:  Vitals:   12/11/17 0910  TempSrc:   PainSc: 0-No pain         Complications: No apparent anesthesia complications

## 2017-12-11 NOTE — Plan of Care (Signed)
  Problem: Pain Managment: Goal: General experience of comfort will improve Outcome: Progressing   

## 2017-12-11 NOTE — Anesthesia Preprocedure Evaluation (Addendum)
Anesthesia Evaluation  Patient identified by MRN, date of birth, ID band Patient awake    Reviewed: Allergy & Precautions, NPO status , Patient's Chart, lab work & pertinent test results  Airway Mallampati: II       Dental  (+) Teeth Intact, Dental Advidsory Given   Pulmonary neg pulmonary ROS,    breath sounds clear to auscultation       Cardiovascular hypertension, On Medications  Rhythm:Regular Rate:Normal     Neuro/Psych    GI/Hepatic negative GI ROS, Neg liver ROS,   Endo/Other  negative endocrine ROS  Renal/GU negative Renal ROS     Musculoskeletal  (+) Arthritis , Osteoarthritis,    Abdominal   Peds  Hematology negative hematology ROS (+)   Anesthesia Other Findings   Reproductive/Obstetrics                            Anesthesia Physical Anesthesia Plan  ASA: III  Anesthesia Plan: General   Post-op Pain Management:    Induction: Intravenous  PONV Risk Score and Plan: 3 and Treatment may vary due to age or medical condition, Ondansetron, Dexamethasone and Midazolam  Airway Management Planned: Oral ETT  Additional Equipment:   Intra-op Plan:   Post-operative Plan: Extubation in OR  Informed Consent: I have reviewed the patients History and Physical, chart, labs and discussed the procedure including the risks, benefits and alternatives for the proposed anesthesia with the patient or authorized representative who has indicated his/her understanding and acceptance.   Dental advisory given and Dental Advisory Given  Plan Discussed with: CRNA and Anesthesiologist  Anesthesia Plan Comments:        Anesthesia Quick Evaluation

## 2017-12-11 NOTE — Interval H&P Note (Signed)
History and Physical Interval Note:  12/11/2017 8:56 AM  Beth [name]  has presented today for surgery, with the diagnosis of LEFT BREAST CANCER  The various methods of treatment have been discussed with the patient and family. After consideration of risks, benefits and other options for treatment, the patient has consented to  Procedure(s): INJECT BLUE DYE LEFT BREAST, LEFT TOTAL MASTECTOMY WITH WITH LEFT AXILLARY DEEP SENTINEL LYMPH NODE BIOPSY (Left) as a surgical intervention .  The patient's history has been reviewed, patient examined, no change in status, stable for surgery.  I have reviewed the patient's chart and labs.  Questions were answered to the patient's satisfaction.     [name]

## 2017-12-11 NOTE — Op Note (Signed)
Patient Name:           Beth [name]   Date of Surgery:        12/11/2017  Pre op Diagnosis:      Cancer left breast  Post op Diagnosis:    Cancer left breast  Procedure:                 Inject blue dye left breast;   left total mastectomy left axillary deep sentinel lymph node biopsy;     Surgeon:                     Edsel Petrin. Dalbert Batman, M.D., FACS  Assistant:                      Sharyn Dross, RNFA   Indication for Assistant: Complex exposure.  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 Dr. Reynaldo Minium is her PCP. She was recently evaluated in the [facility] by Dr. Lindi Adie, Dr. Isidore Moos, and me.      She recently felt a lump in her left breast. Screening mammograms found a 2.8 cm mass in the left breast at the 3 o'clock position 5 cm from the nipple. Ultrasound of the axilla was negative. Image guided biopsy showed grade 2 invasive lobular carcinoma, receptor strongly positive, Ki-67 2%, HER-2 negative On exam the tumor felt much larger MRI shows a solitary tumor in the lateral breast at least 4.5 cm. No other lesions in either breast. Lymph nodes looked normal.      Past history significant for bilateral lumpectomy for benign disease. Hypertension.. Family history reveals breast cancer in a maternal aunt. Colon cancer in a different maternal aunt. Mother died age 36 of old age. Father died of heart disease in his 40s.      Examination revealed a large tumor in the lateral left breast, it feels 6 cm at least.. It comes close to the nipple and areolar complex but is not involving this. I told her that I was concerned about attempting lumpectomy with this because of its large size, significant cosmetic defect, and possibility of positive margins with lobular carcinoma. We had a long talk about different  options for surgical intervention and evaluation and she was to go ahead with the mastectomy. She had some interest in reconstructive options.  She saw a Psychiatric nurse and decided not to have any reconstruction. From my standpoint she will need a left total mastectomy, injection of blue dye, and left axillary sentinel node biopsy.    Operative Findings:       The tumor was palpable in the lateral left breast just lateral to the areolar margin.  It feels about 4 to 5 cm in size.  It is quite mobile.  The skin is not involved.  I found 2 very small sentinel lymph nodes.  None of her axillary lymph nodes appear pathologic.  There is no axillary mass.  Procedure in Detail:          The patient underwent pectoral block and injection of radionuclide in the holding area.  She was brought to the operating room where general endotracheal anesthesia was induced.  Surgical timeout was performed.  Intravenous antibiotics were given.  Following alcohol prep I injected 5 cc of dilute methylene blue into  the left breast, subareolar area and massaged the breast for a few minutes.  The entire left chest wall and axilla were prepped and draped in sterile fashion.      Using a marking pen I planned a transverse elliptical incision.  The incision was made with a knife.  Skin flaps were raised superiorly to the infraclavicular area, laterally to the anterior border of latissimus dorsi muscle, inferiorly to the inframammary crease and medially to the parasternal area.  Using the neoprobe I entered the axilla and found two sentinel lymph nodes and those were sent as separate specimens.  The breast was dissected off of the pectoralis major and minor muscles and the tail of Spence.  These were all resected and sent as a separate specimen.  The lateral skin margin was marked with silk sutures.  Hemostasis was excellent and achieved with electrocautery, metal clips, and 2-0 Vicryl ties up in the axillary space.  The wound was  copiously irrigated.  The appeared to be no bleeding whatsoever.  Two 47 French Blake drains were placed, one up into the axillary area and one across the skin flaps.  These were brought out through separate stab incisions inferolaterally, sutured to the skin with nylon sutures and connected to suction bulbs.  The mastectomy incision was closed in 2 layers.  Subcutaneous tissue closed with interrupted 3-0 Vicryl sutures and the skin closed with a running subcuticular 4-0 Monocryl and Dermabond.  Dry bandages and a breast binder were placed.  The patient tolerated the procedure well was taken to PACU in stable condition.  EBL 75 cc.  Counts correct.  Complications none.   Addendum: I logged onto the [employer] and reviewed her prescription medication history     Renaud Celli M. Dalbert Batman, M.D., FACS General and Minimally Invasive Surgery Breast and Colorectal Surgery  12/11/2017 10:57 AM

## 2017-12-12 ENCOUNTER — Encounter (HOSPITAL_COMMUNITY): Payer: Self-pay | Admitting: General Surgery

## 2017-12-12 DIAGNOSIS — Z886 Allergy status to analgesic agent status: Secondary | ICD-10-CM | POA: Diagnosis not present

## 2017-12-12 DIAGNOSIS — Z803 Family history of malignant neoplasm of breast: Secondary | ICD-10-CM | POA: Diagnosis not present

## 2017-12-12 DIAGNOSIS — Z79899 Other long term (current) drug therapy: Secondary | ICD-10-CM | POA: Diagnosis not present

## 2017-12-12 DIAGNOSIS — Z170 Estrogen receptor positive status [ER+]: Secondary | ICD-10-CM | POA: Diagnosis not present

## 2017-12-12 DIAGNOSIS — I10 Essential (primary) hypertension: Secondary | ICD-10-CM | POA: Diagnosis not present

## 2017-12-12 DIAGNOSIS — C50412 Malignant neoplasm of upper-outer quadrant of left female breast: Secondary | ICD-10-CM | POA: Diagnosis not present

## 2017-12-12 MED ORDER — OCUVITE-LUTEIN PO CAPS
1.0000 | ORAL_CAPSULE | Freq: Every day | ORAL | Status: DC
  Filled 2017-12-12: qty 1

## 2017-12-12 MED ORDER — HYDROCODONE-ACETAMINOPHEN 5-325 MG PO TABS: 1.0000 | ORAL_TABLET | ORAL | 0 refills | Status: DC | PRN

## 2017-12-12 MED ORDER — PROSIGHT PO TABS: 1.0000 | ORAL_TABLET | Freq: Every day | ORAL | Status: DC

## 2017-12-12 NOTE — Progress Notes (Signed)
Discharge home. Home discharge instruction given, no question verbalized. 

## 2017-12-12 NOTE — Discharge Summary (Signed)
Patient ID: Beth [name] [id number] 77 y.o. [birth year]  Admit date: 12/11/2017  Discharge date and time: 12/12/2017  Admitting Physician: [name]  Discharge Physician: [name]  Admission Diagnoses: Breast cancer of upper-outer quadrant of left female breast [facility]) [C50.412]  Discharge Diagnoses: Cancer left breast, upper outer quadrant                                         Family history of breast cancer                                       Hypertension, benign    Operations: Procedure(s): INJECT BLUE DYE LEFT BREAST, LEFT TOTAL MASTECTOMY WITH WITH LEFT AXILLARY DEEP SENTINEL LYMPH NODE BIOPSY  Admission Condition: good  Discharged Condition: good  Indication for Admission: This is a very pleasant 77 year old woman who is brought to the operating room for surgical management of her 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 Dr. Reynaldo Minium is her PCP. She was recently evaluated in the [facility] by Dr. Lindi Adie, Dr. Isidore Moos, and me. She recently felt a lump in her left breast. Screening mammograms found a 2.8 cm mass in the left breast at the 3 o'clock position 5 cm from the nipple. Ultrasound of the axilla was negative. Image guided biopsy showed grade 2 invasive lobular carcinoma, receptor strongly positive, Ki-67 2%, HER-2 negative On exam the tumor felt much larger MRI shows a solitary tumor in the lateral breast at least 4.5 cm. No other lesions in either breast. Lymph nodes looked normal. Past history significant for bilateral lumpectomy for benign disease. Hypertension.. Family history reveals breast cancer in a maternal aunt. Colon cancer in a different maternal aunt. Mother died age 72 of old age. Father died of heart disease in his 60s. Examination revealed a large tumor in the lateral left breast, it feels 6 cm at least.. It comes close to the nipple and areolar complex but is not involving this. I told her that I was  concerned about attempting lumpectomy with this because of its large size, significant cosmetic defect, and possibility of positive margins with lobular carcinoma. We had a long talk about different options for surgical intervention and evaluation and she was to go ahead with the mastectomy. She had some interest in reconstructive options.  She saw a Psychiatric nurse and decided not to have any reconstruction. From my standpoint she will need a left total mastectomy, injection of blue dye, and left axillary sentinel node biopsy.    Hospital Course: On the day of admission the patient was taken to the operating room and she underwent left total mastectomy, injection blue dye left breast, left axillary deep sentinel lymph node biopsy.  The surgery was uneventful.  She was admitted for overnight observation and did very well.  She had minimal pain.  No bleeding.  Was ambulatory by the following morning.  Tolerating diet very well.  Able to void without difficulty.  Examination on postop day 1 revealed that she was alert and friendly and in no distress and wanted to go home.  Lungs were clear.  Left mastectomy wound looked good.  Skin flaps viable.  No hematoma.  Drainage was serosanguineous. She was given instructions in diet, activities.  She was told to continue her usual medications.  We called in a prescription for Norco    but encouraged her to transition to plain Tylenol as quickly as possible.  She was instructed in drain care.  She will follow-up with me in the office in 2 weeks for wound and drain care.  She knows that we will call the pathology report to her later this week as soon as it is available.  She knows that she will need to follow-up with her medical oncologist.  Consults: None  Significant Diagnostic Studies: Surgical pathology  Treatments: surgery: Left total mastectomy with sentinel lymph node biopsy  Disposition: Home  Patient Instructions:  Allergies as of 12/12/2017       Reactions   Aleve [naproxen Sodium] Itching      Medication List    TAKE these medications   B-12 2500 MCG Tabs Take 2,500 mcg by mouth daily after lunch.   Biotin 5000 MCG Tabs Take 5,000 mcg by mouth daily after lunch.   cholecalciferol 1000 units tablet Commonly known as:  VITAMIN D Take 1,000 Units by mouth daily with lunch.   COSAMIN DS PO Take 1 tablet by mouth daily after lunch.   folic acid 903 MCG tablet Commonly known as:  FOLVITE Take 400 mcg by mouth daily after lunch.   HYDROcodone-acetaminophen 5-325 MG tablet Commonly known as:  NORCO Take 1 tablet by mouth every 4 (four) hours as needed for moderate pain or severe pain.   ibuprofen 200 MG tablet Commonly known as:  ADVIL,MOTRIN Take 600 mg by mouth daily as needed (for pain).   letrozole 2.5 MG tablet Commonly known as:  FEMARA Take 1 tablet (2.5 mg total) by mouth daily.   lisinopril-hydrochlorothiazide 20-12.5 MG tablet Commonly known as:  PRINZIDE,ZESTORETIC Take 1 tablet by mouth daily.   LORazepam 1 MG tablet Commonly known as:  ATIVAN Take 1 tablet (1 mg total) by mouth every 8 (eight) hours as needed for anxiety (or nausea).   OCUVITE EXTRA PO Take 1 tablet by mouth daily after lunch.   Potassium 99 MG Tabs Take 99 mg by mouth daily after lunch.   Red Yeast Rice Extract 600 MG Caps Take 600 mg by mouth daily after lunch.   REFRESH TEARS 0.5 % Soln Generic drug:  carboxymethylcellulose Place 1 drop into both eyes 3 (three) times daily as needed (dry eyes).   SALONPAS ARTHRITIS PAIN RELIEF EX Place 1 patch onto the skin daily as needed (knee pain).   vitamin C 1000 MG tablet Take 1,000 mg by mouth daily with lunch.            Discharge Care Instructions  (From admission, onward)        Start     Ordered   12/12/17 0000  Discharge wound care:    Comments:  Keep a written record of the drainage and bring that record with you to the office each time. Change the bandage with  dry gauze, as needed You may take a quick shower, starting Thursday or Friday No tub baths Move your shoulder around several times a day as instructed   12/12/17 0739      Activity: Lots of ambulation.  No driving.  Shoulder range of motion exercises discussed.  Frequent ambulation Diet: low fat, low cholesterol diet Wound Care: as directed  Follow-up:  With Dr. Dalbert Batman in 2 weeks.  Signed: Edsel Petrin. Dalbert Batman, M.D., FACS General and minimally invasive surgery Breast and Colorectal Surgery  12/12/2017, 7:40 AM

## 2017-12-12 NOTE — Discharge Instructions (Signed)
[facility] °[phone] ° °MASTECTOMY: POST OP INSTRUCTIONS ° °Always review your discharge instruction sheet given to you by the facility where your surgery was performed. °IF YOU HAVE DISABILITY OR FAMILY LEAVE FORMS, YOU MUST BRING THEM TO THE OFFICE FOR PROCESSING.   °DO NOT GIVE THEM TO YOUR DOCTOR. °A prescription for pain medication may be given to you upon discharge.  Take your pain medication as prescribed, if needed.  If narcotic pain medicine is not needed, then you may take acetaminophen (Tylenol) or ibuprofen (Advil) as needed. °1. Take your usually prescribed medications unless otherwise directed. °2. If you need a refill on your pain medication, please contact your pharmacy.  They will contact our office to request authorization.  Prescriptions will not be filled after 5pm or on week-ends. °3. You should follow a light diet the first few days after arrival home, such as soup and crackers, etc.  Resume your normal diet the day after surgery. °4. Most patients will experience some swelling and bruising on the chest and underarm.  Ice packs will help.  Swelling and bruising can take several days to resolve.  °5. It is common to experience some constipation if taking pain medication after surgery.  Increasing fluid intake and taking a stool softener (such as Colace) will usually help or prevent this problem from occurring.  A mild laxative (Milk of Magnesia or Miralax) should be taken according to package instructions if there are no bowel movements after 48 hours. °6. Unless discharge instructions indicate otherwise, leave your bandage dry and in place until your next appointment in 3-5 days.  You may take a limited sponge bath.  No tube baths or showers until the drains are removed.  You may have steri-strips (small skin tapes) in place directly over the incision.  These strips should be left on the skin for 7-10 days.  If your surgeon used skin glue on the incision, you may  shower in 24 hours.  The glue will flake off over the next 2-3 weeks.  Any sutures or staples will be removed at the office during your follow-up visit. °7. DRAINS:  If you have drains in place, it is important to keep a list of the amount of drainage produced each day in your drains.  Before leaving the hospital, you should be instructed on drain care.  Call our office if you have any questions about your drains. °8. ACTIVITIES:  You may resume regular (light) daily activities beginning the next day--such as daily self-care, walking, climbing stairs--gradually increasing activities as tolerated.  You may have sexual intercourse when it is comfortable.  Refrain from any heavy lifting or straining until approved by your doctor. °a. You may drive when you are no longer taking prescription pain medication, you can comfortably wear a seatbelt, and you can safely maneuver your car and apply brakes. °b. RETURN TO WORK:  __________________________________________________________ °9. You should see your doctor in the office for a follow-up appointment approximately 3-5 days after your surgery.  Your doctor’s nurse will typically make your follow-up appointment when she calls you with your pathology report.  Expect your pathology report 2-3 business days after your surgery.  You may call to check if you do not hear from us after three days.   °10. OTHER INSTRUCTIONS: ______________________________________________________________________________________________ ____________________________________________________________________________________________ °WHEN TO CALL YOUR DOCTOR: °1. Fever over 101.0 °2. Nausea and/or vomiting °3. Extreme swelling or bruising °4. Continued bleeding from incision. °5. Increased pain, redness, or drainage from the incision. °  The clinic staff is available to answer your questions during regular business hours.  Please don’t hesitate to call and ask to speak to one of the nurses for clinical  concerns.  If you have a medical emergency, go to the nearest emergency room or call 911.  A surgeon from [facility] is always on call at the hospital. °[address], [address], [place], [place]  [address] ? [address] [address], [place], [place]   [address] °[phone] ? [phone] ? FAX [phone] °Web site: [email or web address] °

## 2017-12-13 NOTE — Progress Notes (Signed)
Inform patient of Pathology report,. Breast pathology looks good. There were 2 cancers in the breast...they are completely removed with negative margins The lymph nodes are negative I will discuss this with her in detail at her next office visit Lead me know that he reached her  hmi

## 2017-12-18 ENCOUNTER — Inpatient Hospital Stay: Payer: Medicare Other | Attending: Hematology and Oncology | Admitting: Hematology and Oncology

## 2017-12-18 ENCOUNTER — Telehealth: Payer: Self-pay | Admitting: Adult Health

## 2017-12-18 DIAGNOSIS — Z170 Estrogen receptor positive status [ER+]: Secondary | ICD-10-CM | POA: Diagnosis not present

## 2017-12-18 DIAGNOSIS — Z79811 Long term (current) use of aromatase inhibitors: Secondary | ICD-10-CM | POA: Diagnosis not present

## 2017-12-18 DIAGNOSIS — Z79899 Other long term (current) drug therapy: Secondary | ICD-10-CM | POA: Diagnosis not present

## 2017-12-18 DIAGNOSIS — C50412 Malignant neoplasm of upper-outer quadrant of left female breast: Secondary | ICD-10-CM | POA: Diagnosis not present

## 2017-12-18 DIAGNOSIS — Z9012 Acquired absence of left breast and nipple: Secondary | ICD-10-CM | POA: Diagnosis not present

## 2017-12-18 MED ORDER — ANASTROZOLE 1 MG PO TABS: 1.0000 mg | ORAL_TABLET | Freq: Every day | ORAL | 3 refills | Status: DC

## 2017-12-18 NOTE — Progress Notes (Signed)
Patient Care Team: Burnard Bunting, MD as PCP - General (Internal Medicine) [name], MD as Consulting Physician (General Surgery) Nicholas Lose, MD as Consulting Physician (Hematology and Oncology) Eppie Gibson, MD as Attending Physician (Radiation Oncology)  DIAGNOSIS:  Encounter Diagnosis  Name Primary?  . Malignant neoplasm of upper-outer quadrant of left breast in female, estrogen receptor positive ([place])     SUMMARY OF ONCOLOGIC HISTORY:   Malignant neoplasm of upper-outer quadrant of left breast in female, estrogen receptor positive ([place])   10/09/2017 Initial Diagnosis    Screening detected left breast mass at 3 o'clock position 2.8 x 2.8 x 2.2 cm, lymph nodes negative, biopsy revealed grade 2 invasive lobular cancer ER 95%, PR 95%, Ki-67 2%, HER-2 negative ratio 1.34, T2N0 stage Ib clinical stage AJCC 8      12/11/2017 Surgery    Left mastectomy: Invasive lobular cancer, grade 1, 2 foci largest past 4.5 cm, 0/2 lymph nodes negative, margins negative, ER 95%, PR 95%, Ki-67 2%, HER-2 negative ratio 1.34, T2N0 stage Ib       CHIEF COMPLIANT: Follow-up after recent left mastectomy  INTERVAL HISTORY: Beth [name] is a 73-year with above-mentioned history of left breast cancer treated with mastectomy and is here to discuss results.  She does not have much pain in the chest wall area but her axilla appears to be hurting her.  She still has a drain in the axilla.  She thinks that it can come out even though is only been a week from surgery.  REVIEW OF SYSTEMS:   Constitutional: Denies fevers, chills or abnormal weight loss Eyes: Denies blurriness of vision Ears, nose, mouth, throat, and face: Denies mucositis or sore throat Respiratory: Denies cough, dyspnea or wheezes Cardiovascular: Denies palpitation, chest discomfort Gastrointestinal:  Denies nausea, heartburn or change in bowel habits Skin: Denies abnormal skin rashes Lymphatics: Denies new lymphadenopathy or easy  bruising Neurological:Denies numbness, tingling or new weaknesses Behavioral/Psych: Mood is stable, no new changes  Extremities: No lower extremity edema Breast: Left mastectomy All other systems were reviewed with the patient and are negative.  I have reviewed the past medical history, past surgical history, social history and family history with the patient and they are unchanged from previous note.  ALLERGIES:  is allergic to aleve [naproxen sodium].  MEDICATIONS:  Current Outpatient Medications  Medication Sig Dispense Refill  . anastrozole (ARIMIDEX) 1 MG tablet Take 1 tablet (1 mg total) by mouth daily. 90 tablet 3  . carboxymethylcellulose (REFRESH TEARS) 0.5 % SOLN Place 1 drop into both eyes 3 (three) times daily as needed (dry eyes).    . cholecalciferol (VITAMIN D) 1000 units tablet Take 1,000 Units by mouth daily with lunch.     . ibuprofen (ADVIL,MOTRIN) 200 MG tablet Take 600 mg by mouth daily as needed (for pain).    . Liniments (SALONPAS ARTHRITIS PAIN RELIEF EX) Place 1 patch onto the skin daily as needed (knee pain).     Marland Kitchen lisinopril-hydrochlorothiazide (PRINZIDE,ZESTORETIC) 20-12.5 MG tablet Take 1 tablet by mouth daily.  3  . Multiple Vitamins-Minerals (OCUVITE EXTRA PO) Take 1 tablet by mouth daily after lunch.     . Red Yeast Rice Extract 600 MG CAPS Take 600 mg by mouth daily after lunch.      Current Facility-Administered Medications  Medication Dose Route Frequency Provider Last Rate Last Dose  . 0.9 %  sodium chloride infusion  500 mL Intravenous Continuous Irene Shipper, MD  PHYSICAL EXAMINATION: ECOG PERFORMANCE STATUS: 2 - Symptomatic, <50% confined to bed  Vitals:   12/18/17 1115  BP: (!) 158/60  Pulse: 74  Resp: 17  Temp: 97.9 F (36.6 C)  SpO2: 99%   Filed Weights   12/18/17 1115  Weight: 159 lb 8 oz (72.3 kg)    GENERAL:alert, no distress and comfortable SKIN: skin color, texture, turgor are normal, no rashes or significant  lesions EYES: normal, Conjunctiva are pink and non-injected, sclera clear OROPHARYNX:no exudate, no erythema and lips, buccal mucosa, and tongue normal  NECK: supple, thyroid normal size, non-tender, without nodularity LYMPH:  no palpable lymphadenopathy in the cervical, axillary or inguinal LUNGS: clear to auscultation and percussion with normal breathing effort HEART: regular rate & rhythm and no murmurs and no lower extremity edema ABDOMEN:abdomen soft, non-tender and normal bowel sounds MUSCULOSKELETAL:no cyanosis of digits and no clubbing  NEURO: alert & oriented x 3 with fluent speech, no focal motor/sensory deficits EXTREMITIES: No lower extremity edema  LABORATORY DATA:  I have reviewed the data as listed CMP Latest Ref Rng & Units 12/11/2017 12/04/2017 10/25/2017  Glucose 65 - 99 mg/dL - 113(H) 137  BUN 6 - 20 mg/dL - 28(H) 25  Creatinine 0.44 - 1.00 mg/dL 1.11(H) 1.53(H) 1.23(H)  Sodium 135 - 145 mmol/L - 136 139  Potassium 3.5 - 5.1 mmol/L - 4.1 4.9  Chloride 101 - 111 mmol/L - 100(L) 104  CO2 22 - 32 mmol/L - 24 28  Calcium 8.9 - 10.3 mg/dL - 9.1 10.0  Total Protein 6.5 - 8.1 g/dL - 6.8 7.8  Total Bilirubin 0.3 - 1.2 mg/dL - 0.6 0.4  Alkaline Phos 38 - 126 U/L - 70 86  AST 15 - 41 U/L - 20 19  ALT 14 - 54 U/L - 14 18    Lab Results  Component Value Date   WBC 11.4 (H) 12/11/2017   HGB 10.8 (L) 12/11/2017   HCT 32.6 (L) 12/11/2017   MCV 93.9 12/11/2017   PLT 186 12/11/2017   NEUTROABS 5.0 12/04/2017    ASSESSMENT & PLAN:  Malignant neoplasm of upper-outer quadrant of left breast in female, estrogen receptor positive ([place]) 12/11/2017:Left mastectomy: Invasive lobular cancer, grade 1, 2 foci largest past 4.5 cm, 0/2 lymph nodes negative, margins negative, ER 95%, PR 95%, Ki-67 2%, HER-2 negative ratio 1.34, T2N0 stage Ib  Pathology counseling: I discussed the final pathology report of the patient provided  a copy of this report. I discussed the margins as well as lymph  node surgeries. We also discussed the final staging along with previously performed ER/PR and HER-2/neu testing.  Recommendation: No role of adjuvant radiation because she had mastectomy. Adjuvant antiestrogen therapy with anastrozole 2.5 mg daily x5 years  Anastrozole counseling:We discussed the risks and benefits of anti-estrogen therapy with aromatase inhibitors. These include but not limited to insomnia, hot flashes, mood changes, vaginal dryness, bone density loss, and weight gain. We strongly believe that the benefits far outweigh the risks. Patient understands these risks and consented to starting treatment. Planned treatment duration is 5 years.  Return to clinic in 3 months for toxicity check and survivorship care plan visit   No orders of the defined types were placed in this encounter.  The patient has a good understanding of the overall plan. she agrees with it. she will call with any problems that may develop before the next visit here.   [name], MD 12/18/17

## 2017-12-18 NOTE — Telephone Encounter (Signed)
Gave patient avs and calendar of upcoming appts.  °

## 2017-12-18 NOTE — Assessment & Plan Note (Signed)
12/11/2017:Left mastectomy: Invasive lobular cancer, grade 1, 2 foci largest past 4.5 cm, 0/2 lymph nodes negative, margins negative, ER 95%, PR 95%, Ki-67 2%, HER-2 negative ratio 1.34, T2N0 stage Ib  Pathology counseling: I discussed the final pathology report of the patient provided  a copy of this report. I discussed the margins as well as lymph node surgeries. We also discussed the final staging along with previously performed ER/PR and HER-2/neu testing.  Recommendation: No role of adjuvant radiation because she had mastectomy. Adjuvant antiestrogen therapy with letrozole 2.5 mg daily x5 years  Letrozole counseling:We discussed the risks and benefits of anti-estrogen therapy with aromatase inhibitors. These include but not limited to insomnia, hot flashes, mood changes, vaginal dryness, bone density loss, and weight gain. We strongly believe that the benefits far outweigh the risks. Patient understands these risks and consented to starting treatment. Planned treatment duration is 5 years.  Return to clinic in 3 months for toxicity check and survivorship care plan visit

## 2017-12-19 ENCOUNTER — Encounter: Payer: Self-pay | Admitting: *Deleted

## 2018-01-08 ENCOUNTER — Ambulatory Visit: Payer: Medicare Other | Admitting: Physical Therapy

## 2018-01-15 ENCOUNTER — Other Ambulatory Visit: Payer: Self-pay

## 2018-01-15 ENCOUNTER — Encounter: Payer: Self-pay | Admitting: Physical Therapy

## 2018-01-15 ENCOUNTER — Ambulatory Visit: Payer: Medicare Other | Attending: General Surgery | Admitting: Physical Therapy

## 2018-01-15 DIAGNOSIS — C50412 Malignant neoplasm of upper-outer quadrant of left female breast: Secondary | ICD-10-CM | POA: Diagnosis not present

## 2018-01-15 DIAGNOSIS — R293 Abnormal posture: Secondary | ICD-10-CM | POA: Insufficient documentation

## 2018-01-15 DIAGNOSIS — Z483 Aftercare following surgery for neoplasm: Secondary | ICD-10-CM | POA: Insufficient documentation

## 2018-01-15 DIAGNOSIS — Z170 Estrogen receptor positive status [ER+]: Secondary | ICD-10-CM | POA: Insufficient documentation

## 2018-01-15 NOTE — Therapy (Signed)
[place], [place], [address] Phone: [phone]   Fax:  [phone]  Physical Therapy Treatment  Patient Details  Name: Beth [name] MRN: [id number] Date of Birth: 12/29/1940 Referring Provider: Dr. [name]   Encounter Date: 01/15/2018  PT End of Session - 01/15/18 1325    Visit Number  2    Number of Visits  2    PT Start Time  4709    PT Stop Time  1325    PT Time Calculation (min)  30 min    Activity Tolerance  Patient tolerated treatment well    Behavior During Therapy  [facility] for tasks assessed/performed       Past Medical History:  Diagnosis Date  . Anxiety   . Arthritis    "right knee" (12/11/2017)  . Breast cancer, left breast ([place]) 09/2017  . Hypertension     Past Surgical History:  Procedure Laterality Date  . BREAST BIOPSY Bilateral 1960s   EXCISIONAL BIOPSY  . BUNIONECTOMY Bilateral 1970s  . CHOLECYSTECTOMY N/A 03/22/2016   Procedure: LAPAROSCOPIC CHOLECYSTECTOMY WITH INTRAOPERATIVE CHOLANGIOGRAM;  Surgeon: Armandina Gemma, MD;  Location: [facility];  Service: General;  Laterality: N/A;  . CYST EXCISION Right    palm/thumb-side  . MASTECTOMY COMPLETE / SIMPLE W/ SENTINEL NODE BIOPSY Left 12/11/2017   AXILLARY DEEP SENTINEL LYMPH NODE BIOPSY  . MASTECTOMY W/ SENTINEL NODE BIOPSY Left 12/11/2017   Procedure: INJECT BLUE DYE LEFT BREAST, LEFT TOTAL MASTECTOMY WITH WITH LEFT AXILLARY DEEP SENTINEL LYMPH NODE BIOPSY;  Surgeon: [name], MD;  Location: [facility];  Service: General;  Laterality: Left;  . TONSILLECTOMY      There were no vitals filed for this visit.  Subjective Assessment - 01/15/18 1257    Subjective  Patient reports she underwent a left mastectomy and sentinel node biopsy (0/2 nodes) 12/11/17. No need for chemo or radiation. She still has 1 drain present 5 weeks post op and is frustrated by that. No c/o pain but has some discomfort trying to sleep due to her drain.    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 There were 2 masses located in the upper outer quadrant. It is ER/PR positive and HER2 negative with a Ki67 of 2%. Patient reports she underwent a left mastectomy and sentinel node biopsy (0/2 nodes) 12/11/17    Patient Stated Goals  See if my arm is ok    Currently in Pain?  No/denies         [facility] PT Assessment - 01/15/18 0001      Assessment   Medical Diagnosis  s/p left mastectomy and SLNB    Referring Provider  Dr. [name]    Onset Date/Surgical Date  12/11/17    Hand Dominance  Left    Prior Therapy  Baselines      Precautions   Precautions  Other (comment)    Precaution Comments  recent left mastectomy      Restrictions   Weight Bearing Restrictions  No      Balance Screen   Has the patient fallen in the past 6 months  No    Has the patient had a decrease in activity level because of a fear of falling?   No    Is the patient reluctant to leave their home because of a fear of falling?   No      Home Environment   Living  Environment  Private residence    Living Arrangements  Alone    Available Help at Discharge  Family      Prior Function   Level of [place]  Retired    Leisure  She goes bowling 3x/week but has not returned to that yet      Cognition   Overall Cognitive Status  Within Functional Limits for tasks assessed      Observation/Other Assessments   Observations  Assessed left breast incision site and it appears to be healing well. Mild redness just inferior to incision but she reports it comes and goes.      Posture/Postural Control   Posture/Postural Control  Postural limitations    Postural Limitations  Rounded Shoulders;Forward head      ROM / Strength   AROM / PROM / Strength  AROM      AROM   AROM Assessment Site  Shoulder    Right/Left Shoulder  Left    Right Shoulder Extension  51 Degrees    Right  Shoulder Flexion  97 Degrees Limited by drain; felt it was pulling    Right Shoulder ABduction  113 Degrees Limited by drain    Right Shoulder Internal Rotation  43 Degrees    Right Shoulder External Rotation  80 Degrees        LYMPHEDEMA/ONCOLOGY QUESTIONNAIRE - 01/15/18 1305      Type   Cancer Type  Left breast cancer      Surgeries   Mastectomy Date  12/11/17    Sentinel Lymph Node Biopsy Date  12/11/17    Number Lymph Nodes Removed  2      Treatment   Active Chemotherapy Treatment  No    Past Chemotherapy Treatment  No    Active Radiation Treatment  No    Past Radiation Treatment  No    Current Hormone Treatment  No    Past Hormone Therapy  No      What other symptoms do you have   Are you Having Heaviness or Tightness  No    Are you having Pain  No    Are you having pitting edema  No    Is it Hard or Difficult finding clothes that fit  No    Do you have infections  No    Is there Decreased scar mobility  No    [place] Sign  No      Lymphedema Assessments   Lymphedema Assessments  Upper extremities      Right Upper Extremity Lymphedema   10 cm Proximal to Olecranon Process  27.3 cm  (Pended)     Olecranon Process  23.6 cm  (Pended)     10 cm Proximal to Ulnar Styloid Process  19 cm  (Pended)     Just Proximal to Ulnar Styloid Process  13.9 cm  (Pended)     Across Hand at [employer]  17.1 cm  (Pended)     At [place] of 2nd Digit  5.7 cm  (Pended)       Left Upper Extremity Lymphedema   10 cm Proximal to Olecranon Process  27.9 cm  (Pended)     Olecranon Process  25.2 cm  (Pended)     10 cm Proximal to Ulnar Styloid Process  19 cm  (Pended)     Just Proximal to Ulnar Styloid Process  14.2 cm  (Pended)     Across Hand at [employer]  17.3 cm  (Pended)     At [place] of 2nd Digit  6 cm  (Pended)         Quick Dash - 01/15/18 0001    Open a tight or new jar  No difficulty    Do heavy household chores (wash walls, wash floors)  No difficulty    Carry a  shopping bag or briefcase  No difficulty    Wash your back  No difficulty    Use a knife to cut food  No difficulty    Recreational activities in which you take some force or impact through your arm, shoulder, or hand (golf, hammering, tennis)  No difficulty    During the past week, to what extent has your arm, shoulder or hand problem interfered with your normal social activities with family, friends, neighbors, or groups?  Not at all    During the past week, to what extent has your arm, shoulder or hand problem limited your work or other regular daily activities  Not at all    Arm, shoulder, or hand pain.  None    Tingling (pins and needles) in your arm, shoulder, or hand  Mild    Difficulty Sleeping  Moderate difficulty    DASH Score  6.82 %                     PT Education - 01/15/18 1324    Education provided  Yes    Education Details  Encouraged pt to decrease activity level for 1-2 days to see if drain output reduces. Pt reports being very active and is frustrated by still having a drain in. Educaed pt on exercise and importance of exercise for reducing recurrence risk but asked her to decrease activity until drain is removed.    Person(s) Educated  Patient    Methods  Explanation    Comprehension  Verbalized understanding          PT Long Term Goals - 01/15/18 1328      PT LONG TERM GOAL #1   Title  Patient will demonstrate she has returned to baseline post operatively related to shoulder ROM and function.    Time  8    Period  Weeks    Status  Partially Met      Breast Clinic Goals - 10/25/17 1530      Patient will be able to verbalize understanding of pertinent lymphedema risk reduction practices relevant to her diagnosis specifically related to skin care.   Time  1    Period  Days    Status  Achieved      Patient will be able to return demonstrate and/or verbalize understanding of the post-op home exercise program related to regaining shoulder range  of motion.   Time  1    Period  Days    Status  Achieved      Patient will be able to verbalize understanding of the importance of attending the postoperative After Breast Cancer Class for further lymphedema risk reduction education and therapeutic exercise.   Time  1    Period  Days    Status  Achieved           Plan - 01/15/18 1326    Clinical Impression Statement  Patient is doing very well s.p left mastectomy and SLNB. She still has 1 drain in which she reports being frustrated by but has been very active since surgery. Educated her on how being very active  may be increasing drain output and she verbalized understanding. She has limited left shoulder ROM with flexion and abduction but that appears to be limited by her drain. She may need PT for shoulder ROM after drain is removed but it is difficult to determine until it is removed.    PT Treatment/Interventions  ADLs/Self Care Home Management;Therapeutic exercise;Patient/family education    PT Next Visit Plan  Pt to call clinic after drain is removed if she feels any limitation with shoulder ROM; will D/C for now    PT Home Exercise Plan  Post op shoulder ROM HEP    Consulted and Agree with Plan of Care  Patient       Patient will benefit from skilled therapeutic intervention in order to improve the following deficits and impairments:  Decreased knowledge of precautions, Impaired UE functional use, Decreased range of motion, Postural dysfunction, Pain  Visit Diagnosis: Malignant neoplasm of upper-outer quadrant of left breast in female, estrogen receptor positive ([facility])  Abnormal posture  Aftercare following surgery for neoplasm     Problem List Patient Active Problem List   Diagnosis Date Noted  . Breast cancer of upper-outer quadrant of left female breast ([place]) 12/11/2017  . Malignant neoplasm of upper-outer quadrant of left breast in female, estrogen receptor positive ([place]) 10/13/2017  . Unilateral primary  osteoarthritis, right knee 07/17/2017  . Chronic pain of right knee 07/17/2017  . Cholelithiasis with chronic cholecystitis 03/20/2016    PHYSICAL THERAPY DISCHARGE SUMMARY  Visits from Start of Care: 2  Current functional level related to goals / functional outcomes: Patient partially met goal. Left shoulder ROM measurements are not back to baseline but she is limited by her drain. PT anticipates as soon as drain is removed, she will have full ROM.   Remaining deficits: None   Education / Equipment: HEP and lymphedema risk reduction practices Plan: Patient agrees to discharge.  Patient goals were partially met. Patient is being discharged due to being pleased with the current functional level.  ?????         Annia Friendly, [place] 01/15/18 1:31 PM   [place], [place], [address] Phone: [phone]   Fax:  [phone]  Name: SATORI KRABILL MRN: [id number] Date of Birth: 1941/03/27

## 2018-02-14 ENCOUNTER — Other Ambulatory Visit: Payer: Self-pay | Admitting: General Surgery

## 2018-02-14 ENCOUNTER — Encounter (HOSPITAL_BASED_OUTPATIENT_CLINIC_OR_DEPARTMENT_OTHER): Payer: Self-pay

## 2018-02-14 ENCOUNTER — Other Ambulatory Visit: Payer: Self-pay

## 2018-02-16 ENCOUNTER — Encounter (HOSPITAL_BASED_OUTPATIENT_CLINIC_OR_DEPARTMENT_OTHER)
Admission: RE | Admit: 2018-02-16 | Discharge: 2018-02-16 | Disposition: A | Discharge status: Home / Self Care | Payer: Medicare Other | Source: Ambulatory Visit | Attending: General Surgery | Admitting: General Surgery

## 2018-02-16 DIAGNOSIS — L7634 Postprocedural seroma of skin and subcutaneous tissue following other procedure: Secondary | ICD-10-CM | POA: Diagnosis not present

## 2018-02-16 DIAGNOSIS — I10 Essential (primary) hypertension: Secondary | ICD-10-CM | POA: Diagnosis not present

## 2018-02-16 DIAGNOSIS — C50412 Malignant neoplasm of upper-outer quadrant of left female breast: Secondary | ICD-10-CM | POA: Diagnosis not present

## 2018-02-16 DIAGNOSIS — Z803 Family history of malignant neoplasm of breast: Secondary | ICD-10-CM | POA: Diagnosis not present

## 2018-02-16 DIAGNOSIS — Z79899 Other long term (current) drug therapy: Secondary | ICD-10-CM | POA: Diagnosis not present

## 2018-02-16 DIAGNOSIS — Z9012 Acquired absence of left breast and nipple: Secondary | ICD-10-CM | POA: Diagnosis not present

## 2018-02-16 DIAGNOSIS — N6489 Other specified disorders of breast: Secondary | ICD-10-CM | POA: Diagnosis not present

## 2018-02-16 LAB — BASIC METABOLIC PANEL
ANION GAP: 10 (ref 5–15)
BUN: 15 mg/dL (ref 8–23)
CO2: 22 mmol/L (ref 22–32)
Calcium: 9.3 mg/dL (ref 8.9–10.3)
Chloride: 108 mmol/L (ref 98–111)
Creatinine, Ser: 1.13 mg/dL — ABNORMAL HIGH (ref 0.44–1.00)
GFR calc Af Amer: 53 mL/min — ABNORMAL LOW (ref >60)
GFR calc non Af Amer: 45 mL/min — ABNORMAL LOW (ref >60)
GLUCOSE: 204 mg/dL — AB (ref 70–99)
POTASSIUM: 4.1 mmol/L (ref 3.5–5.1)
Sodium: 140 mmol/L (ref 135–145)

## 2018-02-16 NOTE — Progress Notes (Signed)
Ensure pre surgery drink given with instructions to complete by 0745 dos, pt verbalized understanding. 

## 2018-02-18 NOTE — H&P (Signed)
Sallye Lat Location: [facility] Surgery Patient #: [id number] DOB: 1940/06/25 Widowed / Language: Cleophus Molt / Race: White Female       History of Present Illness     . This is a 77 year old female who returns for evaluation of a recurrent left mastectomy wound seroma. Dr. Reynaldo Minium is her PCP. Dr. Lindi Adie is her oncologist. Current treatment is anastrozole      On December 11, 2017 she underwent left total mastectomy and sentinel node biopsy. She had grade 1 invasive lobular carcinoma, at least 2 foci. The largest being 4.5 cm. Clean margins. Negative nodes. Strongly positive receptors. HER-2/neu negative. Her drains were removed that she developed recurrent seroma. This has been significant. This is been aspirated for or 5 times. It has recurred again. There is no pain or infection. She went back to bowling with her left hand which is the side of the mastectomy. I'm not sure whether this relates to the etiology of the large seroma. She has stopped bowling.      She had a large seroma today. I see no point in continuing needle aspirations. We are going to schedule her for return to the operating room under monitored sedation for replacement of a large drain and will simply have to leave that in for a long time until it dries up. I discussed the indications, details, techniques, and numerous risk of the surgery with her. She understands all these issues. All her questions were answered. She agrees with this plan.    Allergies  No Known Allergies  Allergies Reconciled   Medication History  Illusions C Breast Prosthesis (1 (one) as needed, Taken starting 11/14/2017) Active. (Mastectomy Products) Lisinopril (20MG Tablet, Oral) Active. Medications Reconciled  Vitals Weight: 161.5 lb Height: 62in Body Surface Area: 1.75 m Body Mass Index: 29.54 kg/m  Temp.: 98.78F(Oral)  Pulse: 86 (Regular)  Resp.: 18 (Unlabored)  P.OX: 98% (Room  air)     Physical Exam  General Mental Status-Alert. General Appearance-Not in acute distress. Build & Nutrition-Well nourished. Posture-Normal posture. Gait-Normal. Note: Pleasant. Cooperative. Upbeat personality as usual. No distress.   Head and Neck Head-normocephalic, atraumatic with no lesions or palpable masses. Trachea-midline. Thyroid Gland Characteristics - normal size and consistency and no palpable nodules.  Chest and Lung Exam Chest and lung exam reveals -on auscultation, normal breath sounds, no adventitious sounds and normal vocal resonance.  Breast Note: Left mastectomy incision looks good. No infection or ischemic necrosis. Large seroma, clearly larger than 100 cc palpated without tenderness. Axilla feels fine.   Cardiovascular Cardiovascular examination reveals -normal heart sounds, regular rate and rhythm with no murmurs and femoral artery auscultation bilaterally reveals normal pulses, no bruits, no thrills.  Abdomen Inspection Inspection of the abdomen reveals - No Hernias. Palpation/Percussion Palpation and Percussion of the abdomen reveal - Soft, Non Tender, No Rigidity (guarding), No hepatosplenomegaly and No Palpable abdominal masses.  Neurologic Neurologic evaluation reveals -alert and oriented x 3 with no impairment of recent or remote memory, normal attention span and ability to concentrate, normal sensation and normal coordination.  Musculoskeletal Normal Exam - Bilateral-Upper Extremity Strength Normal and Lower Extremity Strength Normal.    Assessment & Plan  SEROMA OF BREAST (N64.89).   you continue to develop a lot of fluid in the left mastectomy wound We call this a seroma. There is no evidence of infection You are otherwise doing well  We will need to place a new drainage catheter into the left mastectomy wound, or it will not heal.  This will be scheduled as an outpatient surgery under sedation in the  next few days I discussed the indications, techniques, and risk of the surgery with you  HYPERTENSION, BENIGN (I10) FAMILY HISTORY OF BREAST CANCER (Z80.3) PRIMARY CANCER OF UPPER OUTER QUADRANT OF LEFT FEMALE BREAST (C50.412)    Edsel Petrin. Dalbert Batman, M.D., [facility] General and Minimally invasive Surgery Breast and Colorectal Surgery Office:   [phone] Pager:   [phone]

## 2018-02-19 ENCOUNTER — Ambulatory Visit (HOSPITAL_BASED_OUTPATIENT_CLINIC_OR_DEPARTMENT_OTHER)
Admission: RE | Admit: 2018-02-19 | Discharge: 2018-02-19 | Disposition: A | Discharge status: Home / Self Care | Payer: Medicare Other | Source: Ambulatory Visit | Attending: General Surgery | Admitting: General Surgery

## 2018-02-19 ENCOUNTER — Ambulatory Visit (HOSPITAL_BASED_OUTPATIENT_CLINIC_OR_DEPARTMENT_OTHER): Payer: Medicare Other | Admitting: Certified Registered"

## 2018-02-19 ENCOUNTER — Other Ambulatory Visit: Payer: Self-pay

## 2018-02-19 ENCOUNTER — Encounter (HOSPITAL_BASED_OUTPATIENT_CLINIC_OR_DEPARTMENT_OTHER): Payer: Self-pay | Admitting: Certified Registered"

## 2018-02-19 ENCOUNTER — Encounter (HOSPITAL_BASED_OUTPATIENT_CLINIC_OR_DEPARTMENT_OTHER)
Admission: RE | Disposition: A | Discharge status: Home / Self Care | Payer: Self-pay | Source: Ambulatory Visit | Attending: General Surgery

## 2018-02-19 DIAGNOSIS — Z79899 Other long term (current) drug therapy: Secondary | ICD-10-CM | POA: Diagnosis not present

## 2018-02-19 DIAGNOSIS — Z803 Family history of malignant neoplasm of breast: Secondary | ICD-10-CM | POA: Insufficient documentation

## 2018-02-19 DIAGNOSIS — Z9012 Acquired absence of left breast and nipple: Secondary | ICD-10-CM | POA: Insufficient documentation

## 2018-02-19 DIAGNOSIS — I10 Essential (primary) hypertension: Secondary | ICD-10-CM | POA: Insufficient documentation

## 2018-02-19 DIAGNOSIS — C50412 Malignant neoplasm of upper-outer quadrant of left female breast: Secondary | ICD-10-CM | POA: Diagnosis not present

## 2018-02-19 DIAGNOSIS — L7634 Postprocedural seroma of skin and subcutaneous tissue following other procedure: Secondary | ICD-10-CM | POA: Insufficient documentation

## 2018-02-19 DIAGNOSIS — L7633 Postprocedural seroma of skin and subcutaneous tissue following a dermatologic procedure: Secondary | ICD-10-CM | POA: Diagnosis not present

## 2018-02-19 DIAGNOSIS — N6489 Other specified disorders of breast: Secondary | ICD-10-CM | POA: Insufficient documentation

## 2018-02-19 DIAGNOSIS — Z170 Estrogen receptor positive status [ER+]: Secondary | ICD-10-CM

## 2018-02-19 HISTORY — PX: EVACUATION BREAST HEMATOMA: SHX1537

## 2018-02-19 SURGERY — EVACUATION, HEMATOMA, BREAST
Anesthesia: Monitor Anesthesia Care | Site: Breast | Laterality: Left

## 2018-02-19 MED ORDER — PROMETHAZINE HCL 25 MG/ML IJ SOLN: 6.2500 mg | INTRAMUSCULAR | Status: DC | PRN

## 2018-02-19 MED ORDER — ACETAMINOPHEN 500 MG PO TABS
ORAL_TABLET | ORAL | Status: AC
  Filled 2018-02-19: qty 2

## 2018-02-19 MED ORDER — CEFAZOLIN SODIUM-DEXTROSE 2-4 GM/100ML-% IV SOLN
2.0000 g | INTRAVENOUS | Status: AC
  Administered 2018-02-19: 2 g via INTRAVENOUS

## 2018-02-19 MED ORDER — ACETAMINOPHEN 500 MG PO TABS
1000.0000 mg | ORAL_TABLET | ORAL | Status: AC
  Administered 2018-02-19: 1000 mg via ORAL

## 2018-02-19 MED ORDER — PROPOFOL 500 MG/50ML IV EMUL
INTRAVENOUS | Status: DC | PRN
  Administered 2018-02-19: 75 ug/kg/min via INTRAVENOUS

## 2018-02-19 MED ORDER — SCOPOLAMINE 1 MG/3DAYS TD PT72: 1.0000 | MEDICATED_PATCH | Freq: Once | TRANSDERMAL | Status: DC | PRN

## 2018-02-19 MED ORDER — LIDOCAINE HCL (CARDIAC) PF 100 MG/5ML IV SOSY
PREFILLED_SYRINGE | INTRAVENOUS | Status: DC | PRN
  Administered 2018-02-19: 30 mg via INTRAVENOUS

## 2018-02-19 MED ORDER — OXYCODONE HCL 5 MG/5ML PO SOLN: 5.0000 mg | Freq: Once | ORAL | Status: DC | PRN

## 2018-02-19 MED ORDER — CHLORHEXIDINE GLUCONATE CLOTH 2 % EX PADS: 6.0000 | MEDICATED_PAD | Freq: Once | CUTANEOUS | Status: DC

## 2018-02-19 MED ORDER — GABAPENTIN 300 MG PO CAPS
ORAL_CAPSULE | ORAL | Status: AC
  Filled 2018-02-19: qty 1

## 2018-02-19 MED ORDER — LACTATED RINGERS IV SOLN
INTRAVENOUS | Status: DC
  Administered 2018-02-19: 10:00:00 via INTRAVENOUS

## 2018-02-19 MED ORDER — HYDROMORPHONE HCL 1 MG/ML IJ SOLN: 0.2500 mg | INTRAMUSCULAR | Status: DC | PRN

## 2018-02-19 MED ORDER — BUPIVACAINE-EPINEPHRINE 0.5% -1:200000 IJ SOLN
INTRAMUSCULAR | Status: DC | PRN
  Administered 2018-02-19: 6 mL

## 2018-02-19 MED ORDER — FENTANYL CITRATE (PF) 100 MCG/2ML IJ SOLN
50.0000 ug | INTRAMUSCULAR | Status: DC | PRN
  Administered 2018-02-19: 50 ug via INTRAVENOUS

## 2018-02-19 MED ORDER — OXYCODONE HCL 5 MG PO TABS: 5.0000 mg | ORAL_TABLET | Freq: Once | ORAL | Status: DC | PRN

## 2018-02-19 MED ORDER — ONDANSETRON HCL 4 MG/2ML IJ SOLN
INTRAMUSCULAR | Status: DC | PRN
  Administered 2018-02-19: 4 mg via INTRAVENOUS

## 2018-02-19 MED ORDER — MIDAZOLAM HCL 2 MG/2ML IJ SOLN: 1.0000 mg | INTRAMUSCULAR | Status: DC | PRN

## 2018-02-19 MED ORDER — FENTANYL CITRATE (PF) 100 MCG/2ML IJ SOLN
INTRAMUSCULAR | Status: AC
  Filled 2018-02-19: qty 2

## 2018-02-19 MED ORDER — CEFAZOLIN SODIUM-DEXTROSE 2-4 GM/100ML-% IV SOLN
INTRAVENOUS | Status: AC
  Filled 2018-02-19: qty 100

## 2018-02-19 MED ORDER — GABAPENTIN 300 MG PO CAPS
300.0000 mg | ORAL_CAPSULE | ORAL | Status: AC
  Administered 2018-02-19: 300 mg via ORAL

## 2018-02-19 SURGICAL SUPPLY — 58 items
APPLIER CLIP 9.375 MED OPEN (MISCELLANEOUS)
BENZOIN TINCTURE PRP APPL 2/3 (GAUZE/BANDAGES/DRESSINGS) IMPLANT
BLADE HEX COATED 2.75 (ELECTRODE) ×3 IMPLANT
BLADE SURG 15 STRL LF DISP TIS (BLADE) ×1 IMPLANT
BLADE SURG 15 STRL SS (BLADE) ×2
CANISTER SUCT 1200ML W/VALVE (MISCELLANEOUS) ×3 IMPLANT
CHLORAPREP W/TINT 26ML (MISCELLANEOUS) ×3 IMPLANT
CLIP APPLIE 9.375 MED OPEN (MISCELLANEOUS) IMPLANT
CLOSURE WOUND 1/2 X4 (GAUZE/BANDAGES/DRESSINGS)
COVER BACK TABLE 60X90IN (DRAPES) ×3 IMPLANT
COVER MAYO STAND STRL (DRAPES) ×3 IMPLANT
DECANTER SPIKE VIAL GLASS SM (MISCELLANEOUS) IMPLANT
DERMABOND ADVANCED (GAUZE/BANDAGES/DRESSINGS)
DERMABOND ADVANCED .7 DNX12 (GAUZE/BANDAGES/DRESSINGS) IMPLANT
DRAIN CHANNEL 19F RND (DRAIN) ×3 IMPLANT
DRAPE LAPAROTOMY 100X72 PEDS (DRAPES) ×3 IMPLANT
DRAPE UTILITY XL STRL (DRAPES) ×3 IMPLANT
DRSG PAD ABDOMINAL 8X10 ST (GAUZE/BANDAGES/DRESSINGS) ×3 IMPLANT
ELECT REM PT RETURN 9FT ADLT (ELECTROSURGICAL) ×3
ELECTRODE REM PT RTRN 9FT ADLT (ELECTROSURGICAL) ×1 IMPLANT
EVACUATOR SILICONE 100CC (DRAIN) ×3 IMPLANT
GAUZE SPONGE 4X4 12PLY STRL LF (GAUZE/BANDAGES/DRESSINGS) IMPLANT
GLOVE BIO SURGEON STRL SZ 6.5 (GLOVE) ×2 IMPLANT
GLOVE BIO SURGEONS STRL SZ 6.5 (GLOVE) ×1
GLOVE BIOGEL PI IND STRL 7.0 (GLOVE) ×1 IMPLANT
GLOVE BIOGEL PI INDICATOR 7.0 (GLOVE) ×2
GLOVE EUDERMIC 7 POWDERFREE (GLOVE) ×3 IMPLANT
GOWN STRL REUS W/ TWL LRG LVL3 (GOWN DISPOSABLE) ×1 IMPLANT
GOWN STRL REUS W/ TWL XL LVL3 (GOWN DISPOSABLE) ×1 IMPLANT
GOWN STRL REUS W/TWL LRG LVL3 (GOWN DISPOSABLE) ×2
GOWN STRL REUS W/TWL XL LVL3 (GOWN DISPOSABLE) ×2
KIT MARKER MARGIN INK (KITS) IMPLANT
NEEDLE HYPO 25X1 1.5 SAFETY (NEEDLE) ×3 IMPLANT
NS IRRIG 1000ML POUR BTL (IV SOLUTION) ×3 IMPLANT
PACK BASIN DAY SURGERY FS (CUSTOM PROCEDURE TRAY) ×3 IMPLANT
PENCIL BUTTON HOLSTER BLD 10FT (ELECTRODE) ×3 IMPLANT
SLEEVE SCD COMPRESS KNEE MED (MISCELLANEOUS) ×3 IMPLANT
SPONGE LAP 4X18 RFD (DISPOSABLE) ×3 IMPLANT
STRIP CLOSURE SKIN 1/2X4 (GAUZE/BANDAGES/DRESSINGS) IMPLANT
SUT ETHILON 3 0 PS 1 (SUTURE) ×3 IMPLANT
SUT ETHILON 4 0 PS 2 18 (SUTURE) IMPLANT
SUT MNCRL AB 4-0 PS2 18 (SUTURE) IMPLANT
SUT SILK 2 0 SH (SUTURE) IMPLANT
SUT VIC AB 2-0 CT1 27 (SUTURE)
SUT VIC AB 2-0 CT1 TAPERPNT 27 (SUTURE) IMPLANT
SUT VIC AB 2-0 SH 27 (SUTURE)
SUT VIC AB 2-0 SH 27XBRD (SUTURE) IMPLANT
SUT VIC AB 3-0 FS2 27 (SUTURE) IMPLANT
SUT VICRYL 3-0 CR8 SH (SUTURE) IMPLANT
SUT VICRYL 4-0 PS2 18IN ABS (SUTURE) IMPLANT
SWAB COLLECTION DEVICE MRSA (MISCELLANEOUS) ×3 IMPLANT
SWAB CULTURE ESWAB REG 1ML (MISCELLANEOUS) ×3 IMPLANT
SYR 10ML LL (SYRINGE) ×3 IMPLANT
SYR BULB 3OZ (MISCELLANEOUS) IMPLANT
TAPE HYPAFIX 4 X10 (GAUZE/BANDAGES/DRESSINGS) IMPLANT
TUBE CONNECTING 20'X1/4 (TUBING) ×1
TUBE CONNECTING 20X1/4 (TUBING) ×2 IMPLANT
YANKAUER SUCT BULB TIP NO VENT (SUCTIONS) ×3 IMPLANT

## 2018-02-19 NOTE — Anesthesia Preprocedure Evaluation (Signed)
Anesthesia Evaluation  Patient identified by MRN, date of birth, ID band Patient awake    Reviewed: Allergy & Precautions, NPO status , Patient's Chart, lab work & pertinent test results  Airway Mallampati: II       Dental  (+) Teeth Intact, Dental Advidsory Given   Pulmonary neg pulmonary ROS,    breath sounds clear to auscultation       Cardiovascular hypertension, On Medications  Rhythm:Regular Rate:Normal     Neuro/Psych    GI/Hepatic negative GI ROS, Neg liver ROS,   Endo/Other  negative endocrine ROS  Renal/GU negative Renal ROS     Musculoskeletal  (+) Arthritis , Osteoarthritis,    Abdominal   Peds  Hematology negative hematology ROS (+)   Anesthesia Other Findings   Reproductive/Obstetrics                             Anesthesia Physical  Anesthesia Plan  ASA: III  Anesthesia Plan: MAC   Post-op Pain Management:    Induction: Intravenous  PONV Risk Score and Plan: 2 and Treatment may vary due to age or medical condition, Ondansetron and Midazolam  Airway Management Planned: Simple Face Mask  Additional Equipment:   Intra-op Plan:   Post-operative Plan:   Informed Consent: I have reviewed the patients History and Physical, chart, labs and discussed the procedure including the risks, benefits and alternatives for the proposed anesthesia with the patient or authorized representative who has indicated his/her understanding and acceptance.   Dental advisory given and Dental Advisory Given  Plan Discussed with: CRNA and Anesthesiologist  Anesthesia Plan Comments:         Anesthesia Quick Evaluation

## 2018-02-19 NOTE — Discharge Instructions (Signed)
JP Drain [employer] this sheet to all of your post-operative appointments while you have your drains.  Please measure your drains by CC's or ML's.  Make sure you drain and measure your JP Drains 2 or 3 times per day.  At the end of each day, add up totals for the left side and add up totals for the right side.    ( 9 am )     ( 3 pm )        ( 9 pm )                Date L  R  L  R  L  R  Total L/R                                                                                                                                                                                        Post Anesthesia Home Care Instructions  Activity: Get plenty of rest for the remainder of the day. A responsible individual must stay with you for 24 hours following the procedure.  For the next 24 hours, DO NOT: -Drive a car -Paediatric nurse -Drink alcoholic beverages -Take any medication unless instructed by your physician -Make any legal decisions or sign important papers.  Meals: Start with liquid foods such as gelatin or soup. Progress to regular foods as tolerated. Avoid greasy, spicy, heavy foods. If nausea and/or vomiting occur, drink only clear liquids until the nausea and/or vomiting subsides. Call your physician if vomiting continues.  Special Instructions/Symptoms: Your throat may feel dry or sore from the anesthesia or the breathing tube placed in your throat during surgery. If this causes discomfort, gargle with warm salt water. The discomfort should disappear within 24 hours.  If you had a scopolamine patch placed behind your ear for the management of post- operative nausea and/or vomiting:  1. The medication in the patch is effective for 72 hours, after which it should be removed.  Wrap patch in a tissue and discard in the trash. Wash hands thoroughly with soap and water. 2. You may remove the patch earlier than 72 hours if you experience unpleasant side effects which  may include dry mouth, dizziness or visual disturbances. 3. Avoid touching the patch. Wash your hands with soap and water after contact with the patch.   Keep a written record of the drainage and bring that to the office with you each time  Move your left shoulder around some several times a day.  Keep it limber  No bowling  You may shower if you wish, starting on Thursday, 3 days from  now  Take Tylenol, 1000 mg every 6 hours as needed for pain.  You should not need narcotics  Call and set up an appointment if necessary.  See Dr. Dalbert Batman in about 2 weeks.

## 2018-02-19 NOTE — Anesthesia Procedure Notes (Signed)
Procedure Name: MAC Date/Time: 02/19/2018 10:44 AM Performed by: Signe Colt, CRNA Pre-anesthesia Checklist: Patient identified, Emergency Drugs available, Suction available, Patient being monitored and Timeout performed Patient Re-evaluated:Patient Re-evaluated prior to induction Oxygen Delivery Method: Simple face mask

## 2018-02-19 NOTE — Transfer of Care (Signed)
Immediate Anesthesia Transfer of Care Note  Patient: Beth [name]  Procedure(s) Performed: DRAINAGE LEFT MASTECTOMY SITE (Left Breast)  Patient Location: PACU  Anesthesia Type:MAC  Level of Consciousness: awake, alert , oriented and patient cooperative  Airway & Oxygen Therapy: Patient Spontanous Breathing and Patient connected to face mask oxygen  Post-op Assessment: Report given to RN and Post -op Vital signs reviewed and stable  Post vital signs: Reviewed and stable  Last Vitals:  Vitals Value Taken Time  BP 148/76 02/19/2018 11:03 AM  Temp    Pulse 74 02/19/2018 11:04 AM  Resp 8 02/19/2018 11:04 AM  SpO2 99 % 02/19/2018 11:04 AM  Vitals shown include unvalidated device data.  Last Pain:  Vitals:   02/19/18 1000  TempSrc:   PainSc: 0-No pain         Complications: No apparent anesthesia complications

## 2018-02-19 NOTE — Interval H&P Note (Signed)
History and Physical Interval Note:  02/19/2018 10:15 AM  Beth [name]  has presented today for surgery, with the diagnosis of RECURRENT SEROMA,LEFT MASTECTOMY SITE  The various methods of treatment have been discussed with the patient and family. After consideration of risks, benefits and other options for treatment, the patient has consented to  Procedure(s): DRAINAGE LEFT MASTECTOMY SITE (Left) as a surgical intervention .  The patient's history has been reviewed, patient examined, no change in status, stable for surgery.  I have reviewed the patient's chart and labs.  Questions were answered to the patient's satisfaction.     [name]

## 2018-02-19 NOTE — Anesthesia Postprocedure Evaluation (Signed)
Anesthesia Post Note  Patient: Beth [name]  Procedure(s) Performed: DRAINAGE LEFT MASTECTOMY SITE (Left Breast)     Patient location during evaluation: PACU Anesthesia Type: MAC Level of consciousness: awake and alert Pain management: pain level controlled Vital Signs Assessment: post-procedure vital signs reviewed and stable Respiratory status: spontaneous breathing, nonlabored ventilation and respiratory function stable Cardiovascular status: stable and blood pressure returned to baseline Postop Assessment: no apparent nausea or vomiting Anesthetic complications: no    Last Vitals:  Vitals:   02/19/18 1130 02/19/18 1215  BP:  (!) 146/73  Pulse: 65 (!) 53  Resp: 20 18  Temp:  36.6 C  SpO2: 100% 99%    Last Pain:  Vitals:   02/19/18 1215  TempSrc:   PainSc: 0-No pain                 Lynda Rainwater

## 2018-02-19 NOTE — Op Note (Signed)
Patient Name:           Beth [name]   Date of Surgery:        02/19/2018  Pre op Diagnosis:      Recurrent seroma left mastectomy wound                                       Cancer left breast  Post op Diagnosis:    Same  Procedure:                 Drainage left breast seroma  Surgeon:                     Edsel Petrin. Dalbert Batman, M.D., FACS  Assistant:                      OR staff  Operative Indications:      This is a 77 year old female who returns for evaluation of a recurrent left mastectomy wound seroma. Dr. Reynaldo Minium is her PCP. Dr. Lindi Adie is her oncologist. Current treatment is anastrozole      On December 11, 2017 she underwent left total mastectomy and sentinel node biopsy. She had grade 1 invasive lobular carcinoma, at least 2 foci. The largest being 4.5 cm. Clean margins. Negative nodes. Strongly positive receptors. HER-2/neu negative. Her drains were removed that she developed recurrent seroma. This has been significant. This is been aspirated four or 5 times. It has recurred again. There is no pain or infection. She went back to bowling with her left hand which is the side of the mastectomy. I'm not sure whether this relates to the etiology of the large seroma. She has stopped bowling.      She had a large seroma today. I see no point in continuing needle aspirations. We are going to schedule her for return to the operating room under monitored sedation for replacement of a large drain and will simply have to leave that in for a long time until it dries up. I discussed the indications, details, techniques, and numerous risk of the surgery with her. She understands all these issues. All her questions were answered. She agrees with this plan.   Operative Findings:       Clear seroma fluid obtained from left mastectomy wound.  Not obviously infected.  Sent for culture.  Procedure in Detail:          Patient was brought to the operating room placed supine on the operating  table.  She was monitored and sedated by the anesthesia department.  Intravenous antibiotics were given.  Surgical timeout was performed.  The left mastectomy wound was prepped and draped in a sterile fashion.      I could by a lot the fluid in the mastectomy wound.  I chose to make a small incision inferior lateral to the incision.  Using a hemostat I tunneled into the seroma cavity and then inserted a 19 [place] Blake drain well up into the cavity.  Fluid was sent for culture.  The drain was connected to a suction bulb and drained well.  Got at least 50 cc of fluid out.  The drain was sutured to the skin with a 3-0 nylon in a sterile dressing placed.  She tolerated the procedure well.  She was taken to PACU in stable condition.  EBL minimal.  Counts correct.  Complications  none.   Addendum: I logged onto the [employer] website and reviewed her prescription medication history.     Edsel Petrin. Dalbert Batman, M.D., FACS General and Minimally Invasive Surgery Breast and Colorectal Surgery  02/19/2018 11:02 AM

## 2018-02-20 ENCOUNTER — Encounter (HOSPITAL_BASED_OUTPATIENT_CLINIC_OR_DEPARTMENT_OTHER): Payer: Self-pay | Admitting: General Surgery

## 2018-02-24 LAB — AEROBIC/ANAEROBIC CULTURE (SURGICAL/DEEP WOUND): CULTURE: NO GROWTH

## 2018-03-08 DIAGNOSIS — Z23 Encounter for immunization: Secondary | ICD-10-CM | POA: Diagnosis not present

## 2018-03-28 ENCOUNTER — Telehealth: Payer: Self-pay

## 2018-03-28 NOTE — Telephone Encounter (Signed)
Spoke with pt to remind of SCP visit with NP on 03/03/18 at 2 pm.  Pt said she will come to appt.

## 2018-04-02 ENCOUNTER — Telehealth: Payer: Self-pay | Admitting: Hematology and Oncology

## 2018-04-02 ENCOUNTER — Inpatient Hospital Stay: Payer: Medicare Other | Attending: Hematology and Oncology | Admitting: Adult Health

## 2018-04-02 ENCOUNTER — Encounter: Payer: Self-pay | Admitting: Adult Health

## 2018-04-02 VITALS — BP 134/77 | HR 87 | Temp 97.4°F | Resp 17 | Ht 62.0 in | Wt 159.7 lb

## 2018-04-02 DIAGNOSIS — Z79899 Other long term (current) drug therapy: Secondary | ICD-10-CM | POA: Diagnosis not present

## 2018-04-02 DIAGNOSIS — I10 Essential (primary) hypertension: Secondary | ICD-10-CM | POA: Diagnosis not present

## 2018-04-02 DIAGNOSIS — Z9012 Acquired absence of left breast and nipple: Secondary | ICD-10-CM | POA: Diagnosis not present

## 2018-04-02 DIAGNOSIS — Z170 Estrogen receptor positive status [ER+]: Secondary | ICD-10-CM | POA: Insufficient documentation

## 2018-04-02 DIAGNOSIS — Z1239 Encounter for other screening for malignant neoplasm of breast: Secondary | ICD-10-CM

## 2018-04-02 DIAGNOSIS — E2839 Other primary ovarian failure: Secondary | ICD-10-CM

## 2018-04-02 DIAGNOSIS — Z79811 Long term (current) use of aromatase inhibitors: Secondary | ICD-10-CM | POA: Insufficient documentation

## 2018-04-02 DIAGNOSIS — C50412 Malignant neoplasm of upper-outer quadrant of left female breast: Secondary | ICD-10-CM | POA: Diagnosis not present

## 2018-04-02 NOTE — Progress Notes (Addendum)
CLINIC:  Survivorship   REASON FOR VISIT:  Routine follow-up post-treatment for a recent history of breast cancer.  BRIEF ONCOLOGIC HISTORY:    Malignant neoplasm of upper-outer quadrant of left breast in female, estrogen receptor positive ([place])   10/09/2017 Initial Diagnosis    Screening detected left breast mass at 3 o'clock position 2.8 x 2.8 x 2.2 cm, lymph nodes negative, biopsy revealed grade 2 invasive lobular cancer ER 95%, PR 95%, Ki-67 2%, HER-2 negative ratio 1.34, T2N0 stage Ib clinical stage AJCC 8    12/11/2017 Surgery    Left mastectomy: Invasive lobular cancer, grade 1, 2 foci largest past 4.5 cm, 0/2 lymph nodes negative, margins negative, ER 95%, PR 95%, Ki-67 2%, HER-2 negative ratio 1.34, T2N0 stage Ib    12/27/2017 Cancer Staging    Staging form: Breast, AJCC 8th Edition - Pathologic: Stage IA (pT2, pN0, cM0, G1, ER+, PR+, HER2-) - Signed by Gardenia Phlegm, NP on 12/27/2017    12/2017 -  Anti-estrogen oral therapy    Letrozole daily     INTERVAL HISTORY:  Beth [name] presents to the [facility] today for our initial meeting to review her survivorship care plan detailing her treatment course for breast cancer, as well as monitoring long-term side effects of that treatment, education regarding health maintenance, screening, and overall wellness and health promotion.     Overall, Beth [name] reports feeling moderately well.  She is taking Anastrozole daily.  She notes over the past week or two she has had some increased fatigue.  She has minimal arthralgias, she denies hot flashes, she denies vaginal dryness.    [name] had a drain put in her left mastectomy site due to a recurrent seroma.  The output of the drain has decreased to 10-12 ml daily.  She is seeing Dr. Dalbert Batman next week on 10/29.      REVIEW OF SYSTEMS:  Review of Systems  Constitutional: Negative for appetite change, chills, fatigue, fever and unexpected weight change.  HENT:   Negative  for hearing loss, lump/mass and trouble swallowing.   Eyes: Negative for eye problems and icterus.  Respiratory: Negative for chest tightness, cough and shortness of breath.   Cardiovascular: Negative for chest pain, leg swelling and palpitations.  Gastrointestinal: Negative for abdominal distention, abdominal pain, constipation, diarrhea, nausea and vomiting.  Endocrine: Negative for hot flashes.  Skin: Negative for itching and rash.  Neurological: Negative for dizziness, extremity weakness and numbness.  Hematological: Negative for adenopathy. Does not bruise/bleed easily.  Psychiatric/Behavioral: Negative for depression. The patient is not nervous/anxious.    Breast: Denies any new nodularity, masses, tenderness, nipple changes, or nipple discharge.      ONCOLOGY TREATMENT TEAM:  1. Surgeon:  Dr. Dalbert Batman at [facility] 2. Medical Oncologist: Dr. Lindi Adie  3. Radiation Oncologist: Dr. Isidore Moos    PAST MEDICAL/SURGICAL HISTORY:  Past Medical History:  Diagnosis Date  . Arthritis    "right knee" (12/11/2017)  . Breast cancer, left breast ([place]) 09/2017  . Hypertension    Past Surgical History:  Procedure Laterality Date  . BREAST BIOPSY Bilateral 1960s   EXCISIONAL BIOPSY  . BUNIONECTOMY Bilateral 1970s  . CHOLECYSTECTOMY N/A 03/22/2016   Procedure: LAPAROSCOPIC CHOLECYSTECTOMY WITH INTRAOPERATIVE CHOLANGIOGRAM;  Surgeon: Armandina Gemma, MD;  Location: [facility];  Service: General;  Laterality: N/A;  . CYST EXCISION Right    palm/thumb-side  . EVACUATION BREAST HEMATOMA Left 02/19/2018   Procedure: DRAINAGE LEFT MASTECTOMY SITE;  Surgeon: [name], MD;  Location: [facility];  Service: General;  Laterality: Left;  Marland Kitchen MASTECTOMY COMPLETE / SIMPLE W/ SENTINEL NODE BIOPSY Left 12/11/2017   AXILLARY DEEP SENTINEL LYMPH NODE BIOPSY  . MASTECTOMY W/ SENTINEL NODE BIOPSY Left 12/11/2017   Procedure: INJECT BLUE DYE LEFT BREAST, LEFT TOTAL MASTECTOMY WITH WITH LEFT  AXILLARY DEEP SENTINEL LYMPH NODE BIOPSY;  Surgeon: [name], MD;  Location: [place];  Service: General;  Laterality: Left;  . TONSILLECTOMY       ALLERGIES:  Allergies  Allergen Reactions  . Aleve [Naproxen Sodium] Itching     CURRENT MEDICATIONS:  Outpatient Encounter Medications as of 04/02/2018  Medication Sig  . anastrozole (ARIMIDEX) 1 MG tablet Take 1 tablet (1 mg total) by mouth daily.  . carboxymethylcellulose (REFRESH TEARS) 0.5 % SOLN Place 1 drop into both eyes 3 (three) times daily as needed (dry eyes).  . cholecalciferol (VITAMIN D) 1000 units tablet Take 1,000 Units by mouth daily with lunch.   Marland Kitchen glucosamine-chondroitin 500-400 MG tablet Take 1 tablet by mouth daily.  Marland Kitchen ibuprofen (ADVIL,MOTRIN) 200 MG tablet Take 600 mg by mouth daily as needed (for pain).  . Liniments (SALONPAS ARTHRITIS PAIN RELIEF EX) Place 1 patch onto the skin daily as needed (knee pain).   Marland Kitchen lisinopril-hydrochlorothiazide (PRINZIDE,ZESTORETIC) 20-12.5 MG tablet Take 1 tablet by mouth daily.  . Multiple Vitamins-Minerals (OCUVITE EXTRA PO) Take 1 tablet by mouth daily after lunch.   . Red Yeast Rice Extract 600 MG CAPS Take 600 mg by mouth daily after lunch.    No facility-administered encounter medications on file as of 04/02/2018.      ONCOLOGIC FAMILY HISTORY:  Family History  Problem Relation Age of Onset  . Colon cancer Maternal Aunt   . Breast cancer Maternal Aunt   . Colon cancer Maternal Aunt      GENETIC COUNSELING/TESTING: Not at this time  SOCIAL HISTORY:  Social History   Socioeconomic History  . Marital status: Widowed    Spouse name: Not on file  . Number of children: Not on file  . Years of education: Not on file  . Highest education level: Not on file  Occupational History  . Not on file  Social Needs  . Financial resource strain: Not on file  . Food insecurity:    Worry: Not on file    Inability: Not on file  . Transportation needs:    Medical: Not on  file    Non-medical: Not on file  Tobacco Use  . Smoking status: Never Smoker  . Smokeless tobacco: Never Used  Substance and Sexual Activity  . Alcohol use: Yes    Alcohol/week: 2.0 standard drinks    Types: 2 Glasses of wine per week  . Drug use: Never  . Sexual activity: Not Currently  Lifestyle  . Physical activity:    Days per week: Not on file    Minutes per session: Not on file  . Stress: Not on file  Relationships  . Social connections:    Talks on phone: Not on file    Gets together: Not on file    Attends religious service: Not on file    Active member of club or organization: Not on file    Attends meetings of clubs or organizations: Not on file    Relationship status: Not on file  . Intimate partner violence:    Fear of current or ex partner: Not on file    Emotionally abused: Not on file  Physically abused: Not on file    Forced sexual activity: Not on file  Other Topics Concern  . Not on file  Social History Narrative  . Not on file     PHYSICAL EXAMINATION:  Vital Signs:   Vitals:   04/02/18 1407  BP: 134/77  Pulse: 87  Resp: 17  Temp: (!) 97.4 F (36.3 C)  SpO2: 99%   Filed Weights   04/02/18 1407  Weight: 159 lb 11.2 oz (72.4 kg)   General: Well-nourished, well-appearing female in no acute distress.  She is unaccompanied today.   HEENT: Head is normocephalic.  Pupils equal and reactive to light. Conjunctivae clear without exudate.  Sclerae anicteric. Oral mucosa is pink, moist.  Oropharynx is pink without lesions or erythema.  Lymph: No cervical, supraclavicular, or infraclavicular lymphadenopathy noted on palpation.  Cardiovascular: Regular rate and rhythm.Marland Kitchen Respiratory: Clear to auscultation bilaterally. Chest expansion symmetric; breathing non-labored.  Breast: left breast s/p mastectomy, no sign of local recurrence, drain in place, draining serous drainage in tube, right breast benign GI: Abdomen soft and round; non-tender,  non-distended. Bowel sounds normoactive.  GU: Deferred.  Neuro: No focal deficits. Steady gait.  Psych: Mood and affect normal and appropriate for situation.  Extremities: No edema. MSK: No focal spinal tenderness to palpation.  Full range of motion in bilateral upper extremities Skin: Warm and dry.  LABORATORY DATA:  None for this visit.  DIAGNOSTIC IMAGING:  None for this visit.      ASSESSMENT AND PLAN:  Ms.. [name] is a pleasant 77 y.o. female with Stage IA left breast invasive lobular carcinoma, ER+/PR+/HER2-, diagnosed in 09/2017, treated with lumpectomy and anti-estrogen therapy with Anastrozole beginning in 12/2017.  She presents to the Survivorship Clinic for our initial meeting and routine follow-up post-completion of treatment for breast cancer.    1. Stage IA left breast cancer:  Beth [name] is continuing to recover from definitive treatment for breast cancer. She will follow-up with her medical oncologist, Dr. Lindi Adie in 6 months with history and physical exam per surveillance protocol.  She will continue her anti-estrogen therapy with Anastrozole. Thus far, she is tolerating the Anastrozole well, with minimal side effects.  Today, a comprehensive survivorship care plan and treatment summary was reviewed with the patient today detailing her breast cancer diagnosis, treatment course, potential late/long-term effects of treatment, appropriate follow-up care with recommendations for the future, and patient education resources.  A copy of this summary, along with a letter will be sent to the patient's primary care provider via mail/fax/In Basket message after today's visit.    2. Seroma: Decreasing, following with Dr. Dalbert Batman about this.  Will see him next week.  3. Bone health:  Given Beth [name]'s age/history of breast cancer and her current treatment regimen including anti-estrogen therapy with Anastrozole, she is at risk for bone demineralization.  It has been years since her last bone  density per Izora [name], and I ordered this to be done at the breast center in the next few weeks.  In the meantime, she was encouraged to increase her consumption of foods rich in calcium, as well as increase her weight-bearing activities.  She was given education on specific activities to promote bone health.  4. Cancer screening:  Due to Beth [name]'s history and her age, she should receive screening for skin cancers, colon cancer, and gynecologic cancers.  The information and recommendations are listed on the patient's comprehensive care plan/treatment summary and were reviewed in detail with the patient.  5. Health maintenance and wellness promotion: Beth [name] was encouraged to consume 5-7 servings of fruits and vegetables per day. We reviewed the "Nutrition Rainbow" handout, as well as the handout "Take Control of Your Health and Reduce Your Cancer Risk" from the [place].  She was also encouraged to engage in moderate to vigorous exercise for 30 minutes per day most days of the week. We discussed the [employer] fitness program, which is designed for cancer survivors to help them become more physically fit after cancer treatments.  She was instructed to limit her alcohol consumption and continue to abstain from tobacco use.     6. Support services/counseling: It is not uncommon for this period of the patient's cancer care trajectory to be one of many emotions and stressors.  We discussed an opportunity for her to participate in the next session of [facility] ("Finding Your New Normal") support group series designed for patients after they have completed treatment.   Beth [name] was encouraged to take advantage of our many other support services programs, support groups, and/or counseling in coping with her new life as a cancer survivor after completing anti-cancer treatment.  She was offered support today through active listening and expressive supportive counseling.  She was given information  regarding our available services and encouraged to contact me with any questions or for help enrolling in any of our support group/programs.    Dispo:   -Return to cancer center in 6 months for f/u with Dr. Lindi Adie  -Mammogram due in 09/2018 -Bone density this month -Follow up with surgery 04/09/2018 -She is welcome to return back to the Survivorship Clinic at any time; no additional follow-up needed at this time.  -Consider referral back to survivorship as a long-term survivor for continued surveillance  A total of (30) minutes of face-to-face time was spent with this patient with greater than 50% of that time in counseling and care-coordination.   Gardenia Phlegm, [place] [phone]   Note: PRIMARY CARE PROVIDER Burnard Bunting, [place] [phone]

## 2018-04-02 NOTE — Telephone Encounter (Signed)
Gave avs and calendar ° °

## 2018-04-30 ENCOUNTER — Ambulatory Visit
Admission: RE | Admit: 2018-04-30 | Discharge: 2018-04-30 | Disposition: A | Discharge status: Home / Self Care | Payer: Medicare Other | Source: Ambulatory Visit | Attending: Adult Health | Admitting: Adult Health

## 2018-04-30 DIAGNOSIS — M8589 Other specified disorders of bone density and structure, multiple sites: Secondary | ICD-10-CM | POA: Diagnosis not present

## 2018-04-30 DIAGNOSIS — E2839 Other primary ovarian failure: Secondary | ICD-10-CM

## 2018-04-30 DIAGNOSIS — Z780 Asymptomatic menopausal state: Secondary | ICD-10-CM | POA: Diagnosis not present

## 2018-05-01 ENCOUNTER — Telehealth: Payer: Self-pay

## 2018-05-01 NOTE — Telephone Encounter (Signed)
-----   Message from Gardenia Phlegm, NP sent at 05/01/2018  8:16 AM EST ----- Patient has slightly decreased bone density/osteopenia.  Please recommend calcium, vitamin d, weight bearing exercises.   ----- Message ----- From: Interface, Rad Results In Sent: 04/30/2018   4:03 PM EST To: Gardenia Phlegm, NP

## 2018-05-01 NOTE — Telephone Encounter (Signed)
Spoke to patient in regards to BD results that show osteopenia.  NP recommends adding calcium and vitamin d daily as well as weight bearing exercise.  Patient voiced understanding and thanks for call.  No questions/concerns at this time.

## 2018-05-15 DIAGNOSIS — I10 Essential (primary) hypertension: Secondary | ICD-10-CM | POA: Diagnosis not present

## 2018-05-15 DIAGNOSIS — N6489 Other specified disorders of breast: Secondary | ICD-10-CM | POA: Diagnosis not present

## 2018-05-15 DIAGNOSIS — Z803 Family history of malignant neoplasm of breast: Secondary | ICD-10-CM | POA: Diagnosis not present

## 2018-05-15 DIAGNOSIS — C50412 Malignant neoplasm of upper-outer quadrant of left female breast: Secondary | ICD-10-CM | POA: Diagnosis not present

## 2018-06-15 DIAGNOSIS — E559 Vitamin D deficiency, unspecified: Secondary | ICD-10-CM | POA: Diagnosis not present

## 2018-06-15 DIAGNOSIS — R7301 Impaired fasting glucose: Secondary | ICD-10-CM | POA: Diagnosis not present

## 2018-06-15 DIAGNOSIS — R82998 Other abnormal findings in urine: Secondary | ICD-10-CM | POA: Diagnosis not present

## 2018-06-15 DIAGNOSIS — I10 Essential (primary) hypertension: Secondary | ICD-10-CM | POA: Diagnosis not present

## 2018-06-15 DIAGNOSIS — E7849 Other hyperlipidemia: Secondary | ICD-10-CM | POA: Diagnosis not present

## 2018-06-20 DIAGNOSIS — I10 Essential (primary) hypertension: Secondary | ICD-10-CM | POA: Diagnosis not present

## 2018-06-20 DIAGNOSIS — Z0000 Encounter for general adult medical examination without abnormal findings: Secondary | ICD-10-CM | POA: Diagnosis not present

## 2018-06-20 DIAGNOSIS — G542 Cervical root disorders, not elsewhere classified: Secondary | ICD-10-CM | POA: Diagnosis not present

## 2018-06-20 DIAGNOSIS — M179 Osteoarthritis of knee, unspecified: Secondary | ICD-10-CM | POA: Diagnosis not present

## 2018-06-20 DIAGNOSIS — N183 Chronic kidney disease, stage 3 (moderate): Secondary | ICD-10-CM | POA: Diagnosis not present

## 2018-06-20 DIAGNOSIS — L659 Nonscarring hair loss, unspecified: Secondary | ICD-10-CM | POA: Diagnosis not present

## 2018-06-20 DIAGNOSIS — M1990 Unspecified osteoarthritis, unspecified site: Secondary | ICD-10-CM | POA: Diagnosis not present

## 2018-06-20 DIAGNOSIS — E559 Vitamin D deficiency, unspecified: Secondary | ICD-10-CM | POA: Diagnosis not present

## 2018-06-20 DIAGNOSIS — M7050 Other bursitis of knee, unspecified knee: Secondary | ICD-10-CM | POA: Diagnosis not present

## 2018-06-20 DIAGNOSIS — E7849 Other hyperlipidemia: Secondary | ICD-10-CM | POA: Diagnosis not present

## 2018-06-20 DIAGNOSIS — Z9012 Acquired absence of left breast and nipple: Secondary | ICD-10-CM | POA: Diagnosis not present

## 2018-06-20 DIAGNOSIS — Z1389 Encounter for screening for other disorder: Secondary | ICD-10-CM | POA: Diagnosis not present

## 2018-06-21 DIAGNOSIS — Z1212 Encounter for screening for malignant neoplasm of rectum: Secondary | ICD-10-CM | POA: Diagnosis not present

## 2018-08-27 DIAGNOSIS — H524 Presbyopia: Secondary | ICD-10-CM | POA: Diagnosis not present

## 2018-08-27 DIAGNOSIS — H353132 Nonexudative age-related macular degeneration, bilateral, intermediate dry stage: Secondary | ICD-10-CM | POA: Diagnosis not present

## 2018-08-27 DIAGNOSIS — H2513 Age-related nuclear cataract, bilateral: Secondary | ICD-10-CM | POA: Diagnosis not present

## 2018-09-20 NOTE — Assessment & Plan Note (Signed)
12/11/2017:Left mastectomy: Invasive lobular cancer, grade 1, 2 foci largest past 4.5 cm, 0/2 lymph nodes negative, margins negative, ER 95%, PR 95%, Ki-67 2%, HER-2 negative ratio 1.34, T2N0 stage Ib  Treatment plan: No role of adjuvant radiation because she had mastectomy. Adjuvant antiestrogen therapy with anastrozole 2.5 mg daily x5 years started 12/18/2017  Anastrozole toxicities:  Breast cancer surveillance: Mammogram has been ordered  Return to clinic in 1 year for follow-up

## 2018-10-01 ENCOUNTER — Telehealth: Payer: Self-pay | Admitting: Hematology and Oncology

## 2018-10-01 NOTE — Telephone Encounter (Signed)
Spoke with patient regarding doing a Webex visit with Dr. Lindi Adie tomorrow. Patient said she didn't have those capabilities but would like to have a phone call visit.

## 2018-10-01 NOTE — Progress Notes (Signed)
  HEMATOLOGY-ONCOLOGY TELEPHONE VISIT PROGRESS NOTE  I connected with Beth [name] on 10/02/2018 at  2:00 PM EDT by telephone and verified that I am speaking with the correct person using two identifiers.  I discussed the limitations, risks, security and privacy concerns of performing an evaluation and management service by telephone and the availability of in person appointments.  I also discussed with the patient that there may be a patient responsible charge related to this service. The patient expressed understanding and agreed to proceed.   History of Present Illness: Beth [name] is a 78 y.o. female with above-mentioned history of left breast cancer treated with mastectomy and who is currently on anastrozole. Her most recent bone density scan from 04/30/18 showed a T-score of -1.8. She presents today over the phone for annual follow-up.  She denies any major problems other than occasional mood swings occasional hot flashes and some joint stiffness which are not bothering her.    Malignant neoplasm of upper-outer quadrant of left breast in female, estrogen receptor positive ([place])   10/09/2017 Initial Diagnosis    Screening detected left breast mass at 3 o'clock position 2.8 x 2.8 x 2.2 cm, lymph nodes negative, biopsy revealed grade 2 invasive lobular cancer ER 95%, PR 95%, Ki-67 2%, HER-2 negative ratio 1.34, T2N0 stage Ib clinical stage AJCC 8    12/11/2017 Surgery    Left mastectomy: Invasive lobular cancer, grade 1, 2 foci largest past 4.5 cm, 0/2 lymph nodes negative, margins negative, ER 95%, PR 95%, Ki-67 2%, HER-2 negative ratio 1.34, T2N0 stage Ib    12/27/2017 Cancer Staging    Staging form: Breast, AJCC 8th Edition - Pathologic: Stage IA (pT2, pN0, cM0, G1, ER+, PR+, HER2-) - Signed by Gardenia Phlegm, NP on 12/27/2017    12/2017 -  Anti-estrogen oral therapy    Anastrozole     Observations/Objective:  Denies any lumps or nodules in the breast.  Mild  discomfort in the surgical scars   Assessment Plan:  Malignant neoplasm of upper-outer quadrant of left breast in female, estrogen receptor positive ([place]) 12/11/2017:Left mastectomy: Invasive lobular cancer, grade 1, 2 foci largest past 4.5 cm, 0/2 lymph nodes negative, margins negative, ER 95%, PR 95%, Ki-67 2%, HER-2 negative ratio 1.34, T2N0 stage Ib  Treatment plan: No role of adjuvant radiation because she had mastectomy. Adjuvant antiestrogen therapy with anastrozole 2.5 mg daily x5 years started 12/18/2017  Anastrozole toxicities: 1. Occ Hot flashes 2. Muscle aches 3. Mood swings  Breast cancer surveillance: Mammogram has been ordered 11/27/18  Return to clinic in 1 year for follow-up  I discussed the assessment and treatment plan with the patient. The patient was provided an opportunity to ask questions and all were answered. The patient agreed with the plan and demonstrated an understanding of the instructions. The patient was advised to call back or seek an in-person evaluation if the symptoms worsen or if the condition fails to improve as anticipated.   I provided 15 minutes of non-face-to-face time during this encounter.   Rulon Eisenmenger, MD 10/02/2018     I, Molly Dorshimer, am acting as scribe for Nicholas Lose, MD.  I have reviewed the above documentation for accuracy and completeness, and I agree with the above.

## 2018-10-02 ENCOUNTER — Inpatient Hospital Stay: Payer: Medicare Other | Attending: Hematology and Oncology | Admitting: Hematology and Oncology

## 2018-10-02 DIAGNOSIS — Z170 Estrogen receptor positive status [ER+]: Secondary | ICD-10-CM

## 2018-10-02 DIAGNOSIS — C50412 Malignant neoplasm of upper-outer quadrant of left female breast: Secondary | ICD-10-CM | POA: Diagnosis not present

## 2018-10-02 MED ORDER — ANASTROZOLE 1 MG PO TABS: 1.0000 mg | ORAL_TABLET | Freq: Every day | ORAL | 3 refills | Status: DC

## 2018-11-06 DIAGNOSIS — H2512 Age-related nuclear cataract, left eye: Secondary | ICD-10-CM | POA: Diagnosis not present

## 2018-11-06 DIAGNOSIS — H25812 Combined forms of age-related cataract, left eye: Secondary | ICD-10-CM | POA: Diagnosis not present

## 2018-11-27 ENCOUNTER — Ambulatory Visit
Admission: RE | Admit: 2018-11-27 | Discharge: 2018-11-27 | Disposition: A | Discharge status: Home / Self Care | Payer: Medicare Other | Source: Ambulatory Visit | Attending: Adult Health | Admitting: Adult Health

## 2018-11-27 ENCOUNTER — Other Ambulatory Visit: Payer: Self-pay

## 2018-11-27 DIAGNOSIS — Z1239 Encounter for other screening for malignant neoplasm of breast: Secondary | ICD-10-CM

## 2018-11-27 DIAGNOSIS — Z1231 Encounter for screening mammogram for malignant neoplasm of breast: Secondary | ICD-10-CM | POA: Diagnosis not present

## 2018-12-04 DIAGNOSIS — H2511 Age-related nuclear cataract, right eye: Secondary | ICD-10-CM | POA: Diagnosis not present

## 2018-12-04 DIAGNOSIS — H25811 Combined forms of age-related cataract, right eye: Secondary | ICD-10-CM | POA: Diagnosis not present

## 2018-12-17 DIAGNOSIS — G542 Cervical root disorders, not elsewhere classified: Secondary | ICD-10-CM | POA: Diagnosis not present

## 2018-12-17 DIAGNOSIS — L659 Nonscarring hair loss, unspecified: Secondary | ICD-10-CM | POA: Diagnosis not present

## 2018-12-17 DIAGNOSIS — Z9012 Acquired absence of left breast and nipple: Secondary | ICD-10-CM | POA: Diagnosis not present

## 2018-12-17 DIAGNOSIS — E559 Vitamin D deficiency, unspecified: Secondary | ICD-10-CM | POA: Diagnosis not present

## 2018-12-17 DIAGNOSIS — R7301 Impaired fasting glucose: Secondary | ICD-10-CM | POA: Diagnosis not present

## 2018-12-17 DIAGNOSIS — M179 Osteoarthritis of knee, unspecified: Secondary | ICD-10-CM | POA: Diagnosis not present

## 2018-12-17 DIAGNOSIS — N183 Chronic kidney disease, stage 3 (moderate): Secondary | ICD-10-CM | POA: Diagnosis not present

## 2018-12-17 DIAGNOSIS — I10 Essential (primary) hypertension: Secondary | ICD-10-CM | POA: Diagnosis not present

## 2018-12-17 DIAGNOSIS — M7050 Other bursitis of knee, unspecified knee: Secondary | ICD-10-CM | POA: Diagnosis not present

## 2018-12-17 DIAGNOSIS — E785 Hyperlipidemia, unspecified: Secondary | ICD-10-CM | POA: Diagnosis not present

## 2018-12-17 DIAGNOSIS — M1990 Unspecified osteoarthritis, unspecified site: Secondary | ICD-10-CM | POA: Diagnosis not present

## 2018-12-17 DIAGNOSIS — D239 Other benign neoplasm of skin, unspecified: Secondary | ICD-10-CM | POA: Diagnosis not present

## 2019-02-19 DIAGNOSIS — Z23 Encounter for immunization: Secondary | ICD-10-CM | POA: Diagnosis not present

## 2019-02-19 IMAGING — US ULTRASOUND LEFT BREAST LIMITED
1 series · 10 of 10 positions shown · non-contrast
Comparison: 09/28/2017 and earlier

CLINICAL DATA: Possible LEFT breast mass. Remote benign excisional
biopsy in the UPPER-OUTER QUADRANT of the LEFT breast.

EXAM:
DIGITAL DIAGNOSTIC LEFT MAMMOGRAM WITH CAD AND TOMO
ULTRASOUND LEFT BREAST

[Series 1: ultrasound left breast limited · 0.09mm/px · 10 of 10 slices shown]
[im 1/10]
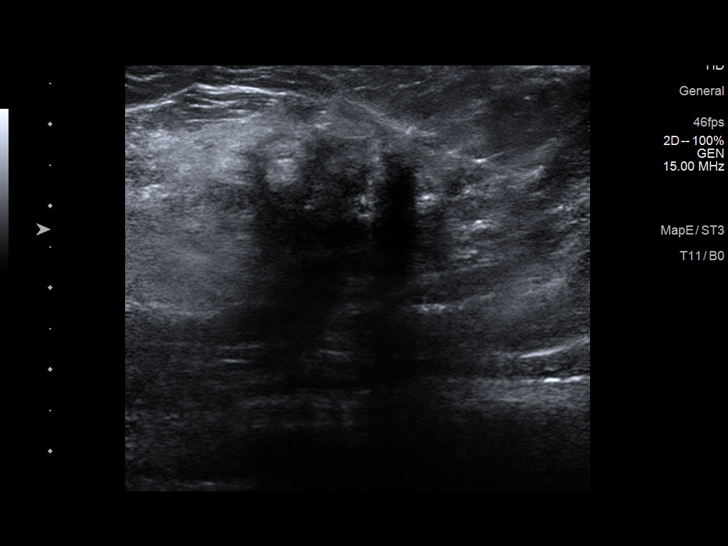
[im 2/10]
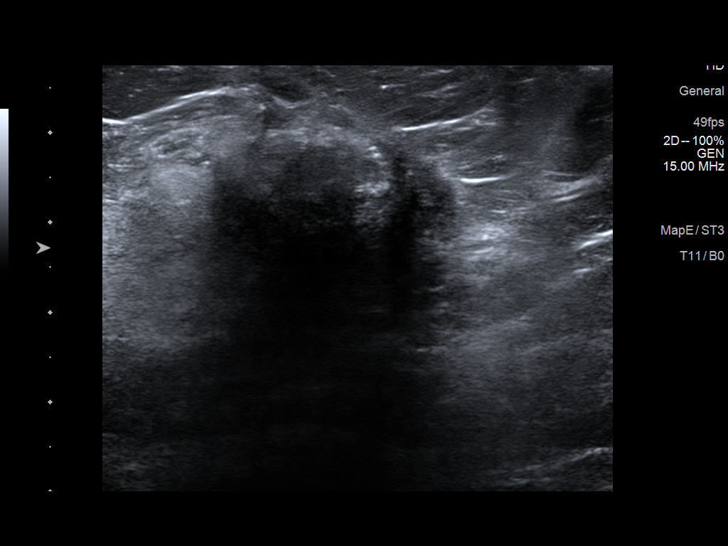
[im 3/10]
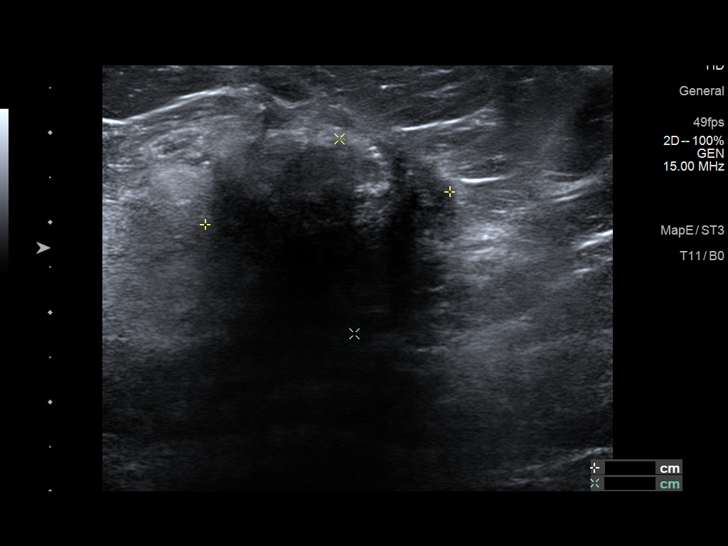
[im 4/10]
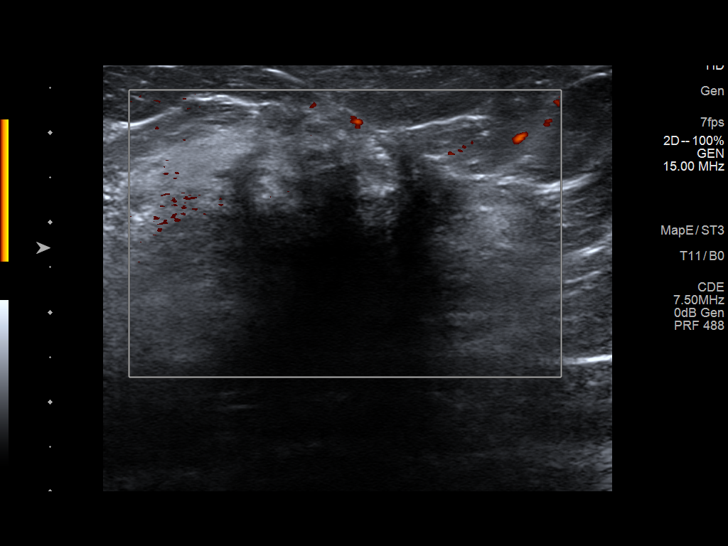
[im 5/10]
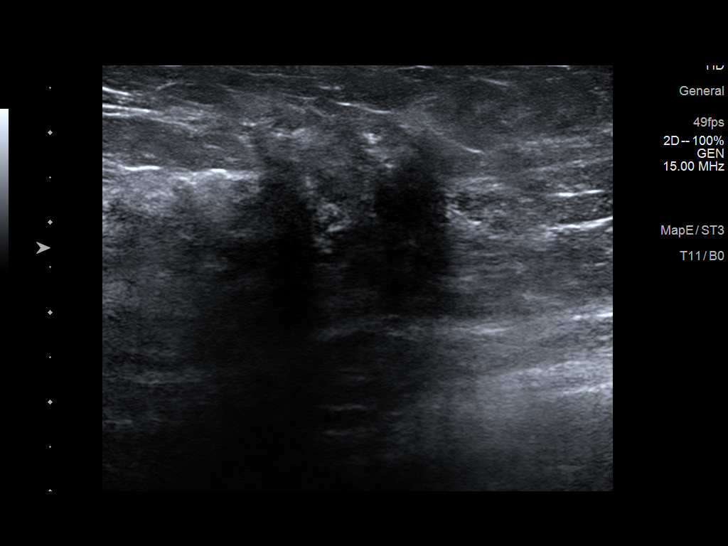
[im 6/10]
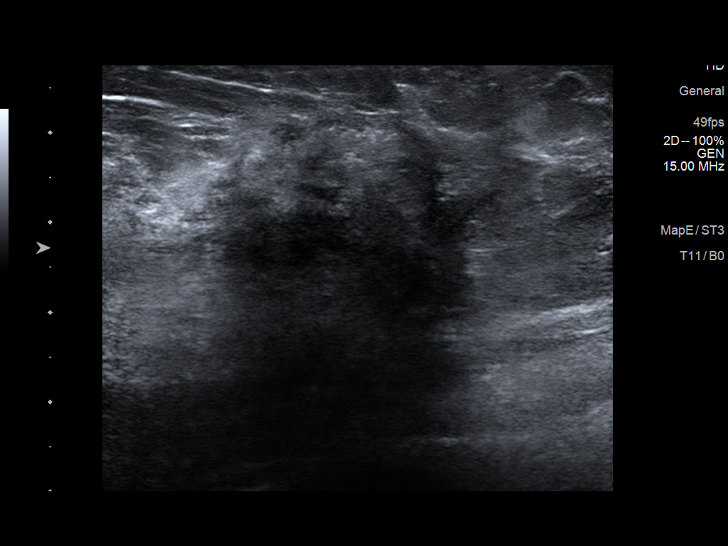
[im 7/10]
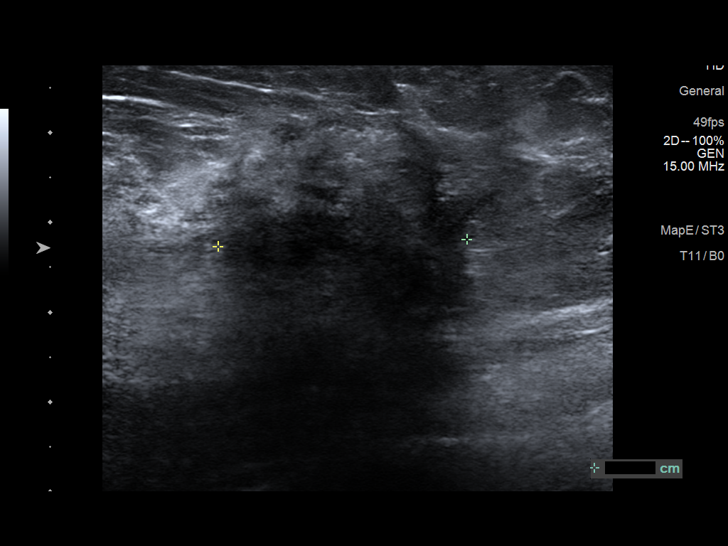
[im 8/10]
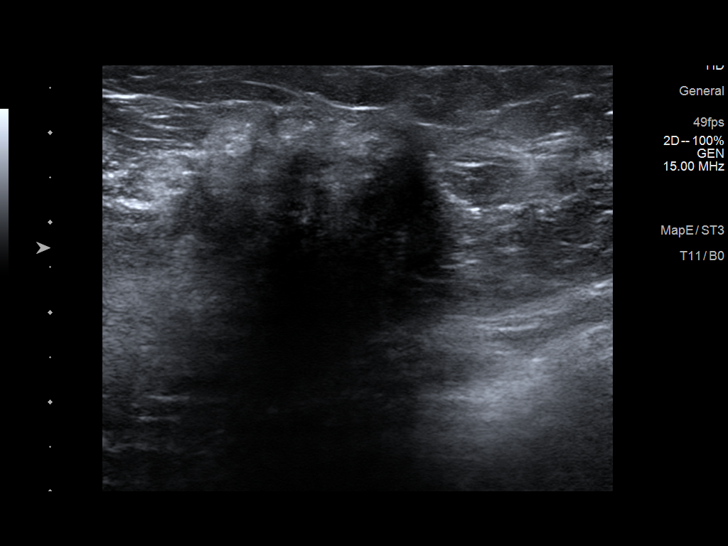
[im 9/10]
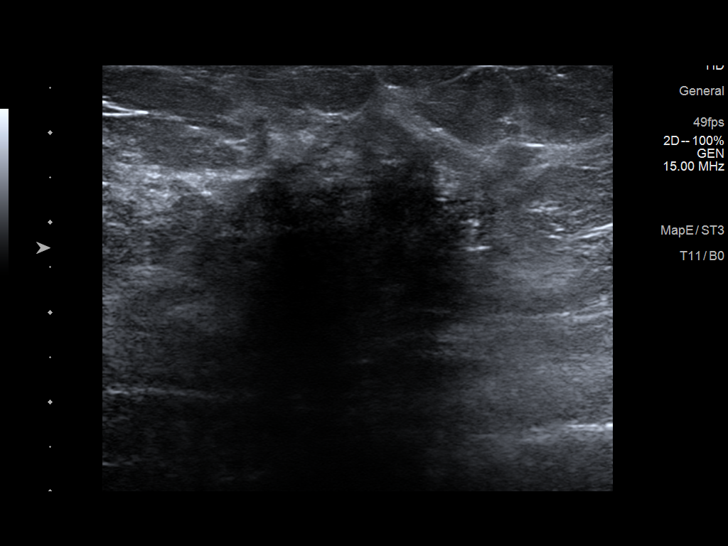
[im 10/10]
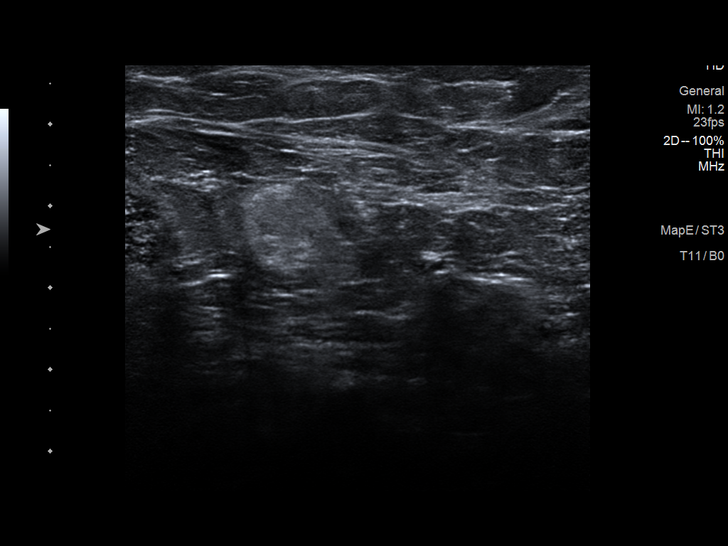

[10 of 10 positions shown; findings below may reference images not displayed]

ACR Breast Density Category c: The breast tissue is heterogeneously
dense, which may obscure small masses.
FINDINGS: Additional 2-D and 3-D images are performed. Spot compression views
confirm presence of a spiculated mass in the LATERAL aspect of the
LEFT breast.

Mammographic images were processed with CAD.

On physical exam, I palpate a discrete firm mass in the 3 o'clock
location of the LEFT breast.

Targeted ultrasound is performed, showing irregular hypoechoic mass
with irregular margins and posterior acoustic shadowing in the 3
o'clock location of the LEFT breast 5 centimeters from the nipple.
Mass is 2.8 x 2.2 x 2.8 centimeters. Evaluation of the LEFT axilla
is negative for adenopathy.
IMPRESSION: Suspicious mass in the 3 o'clock location of the LEFT breast
warranting biopsy.

RECOMMENDATION:
Ultrasound-guided core biopsy is recommended and scheduled for the
patient.

I have discussed the findings and recommendations with the patient.
Results were also provided in writing at the conclusion of the
visit. If applicable, a reminder letter will be sent to the patient
regarding the next appointment.

BI-RADS CATEGORY  4: Suspicious.

## 2019-02-19 IMAGING — MG DIGITAL DIAGNOSTIC UNILATERAL LEFT MAMMOGRAM WITH TOMO AND CAD
4 series · 4 of 12 positions shown · non-contrast
Comparison: 09/28/2017 and earlier

CLINICAL DATA: Possible LEFT breast mass. Remote benign excisional
biopsy in the UPPER-OUTER QUADRANT of the LEFT breast.

EXAM:
DIGITAL DIAGNOSTIC LEFT MAMMOGRAM WITH CAD AND TOMO
ULTRASOUND LEFT BREAST

[L MLO synth-2D]
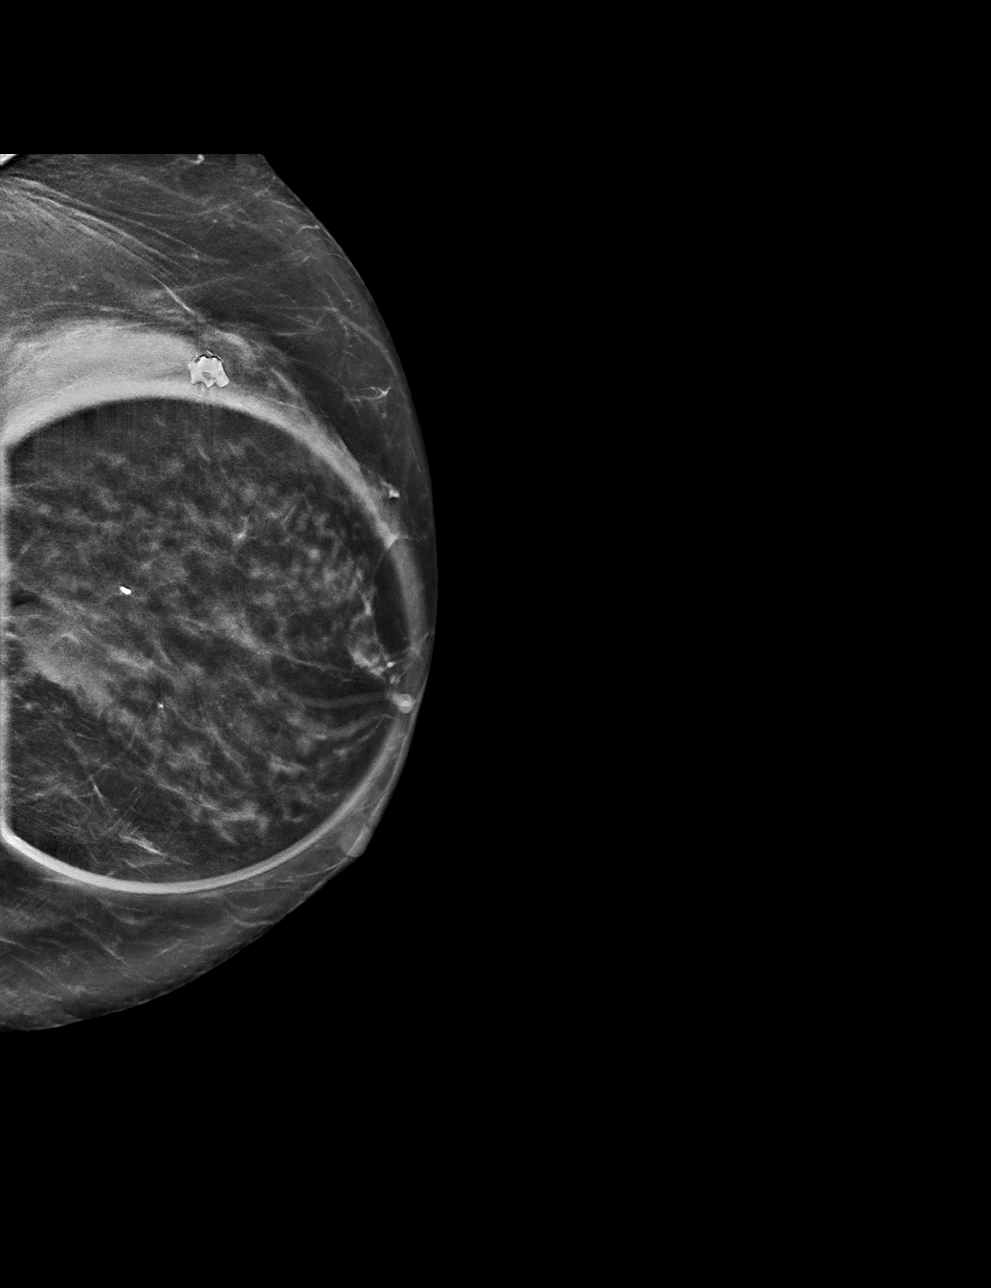

[L CC synth-2D]
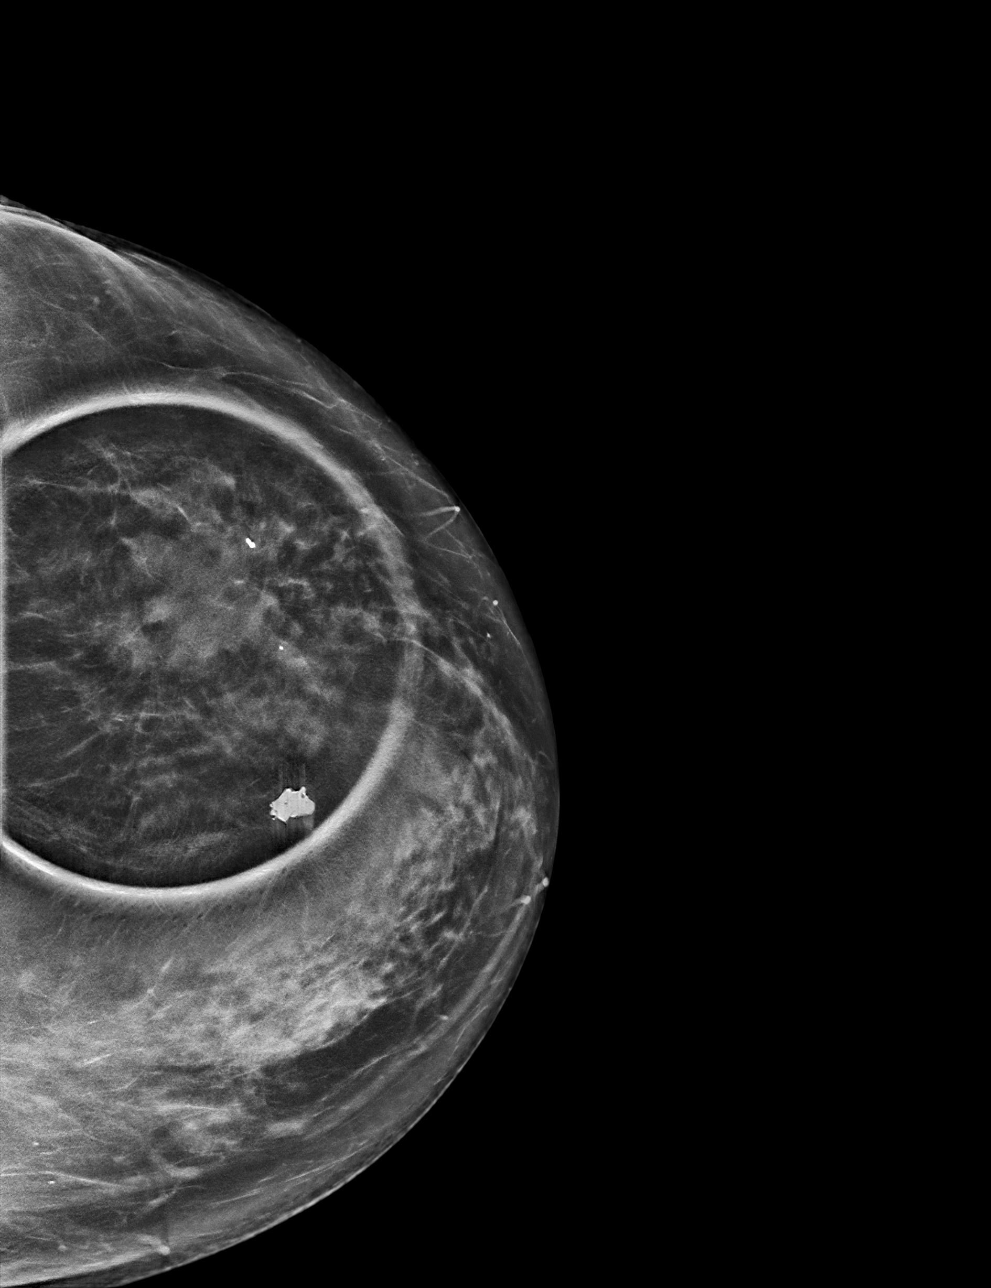

[L CC tomo · tomo slice 35/69.0]
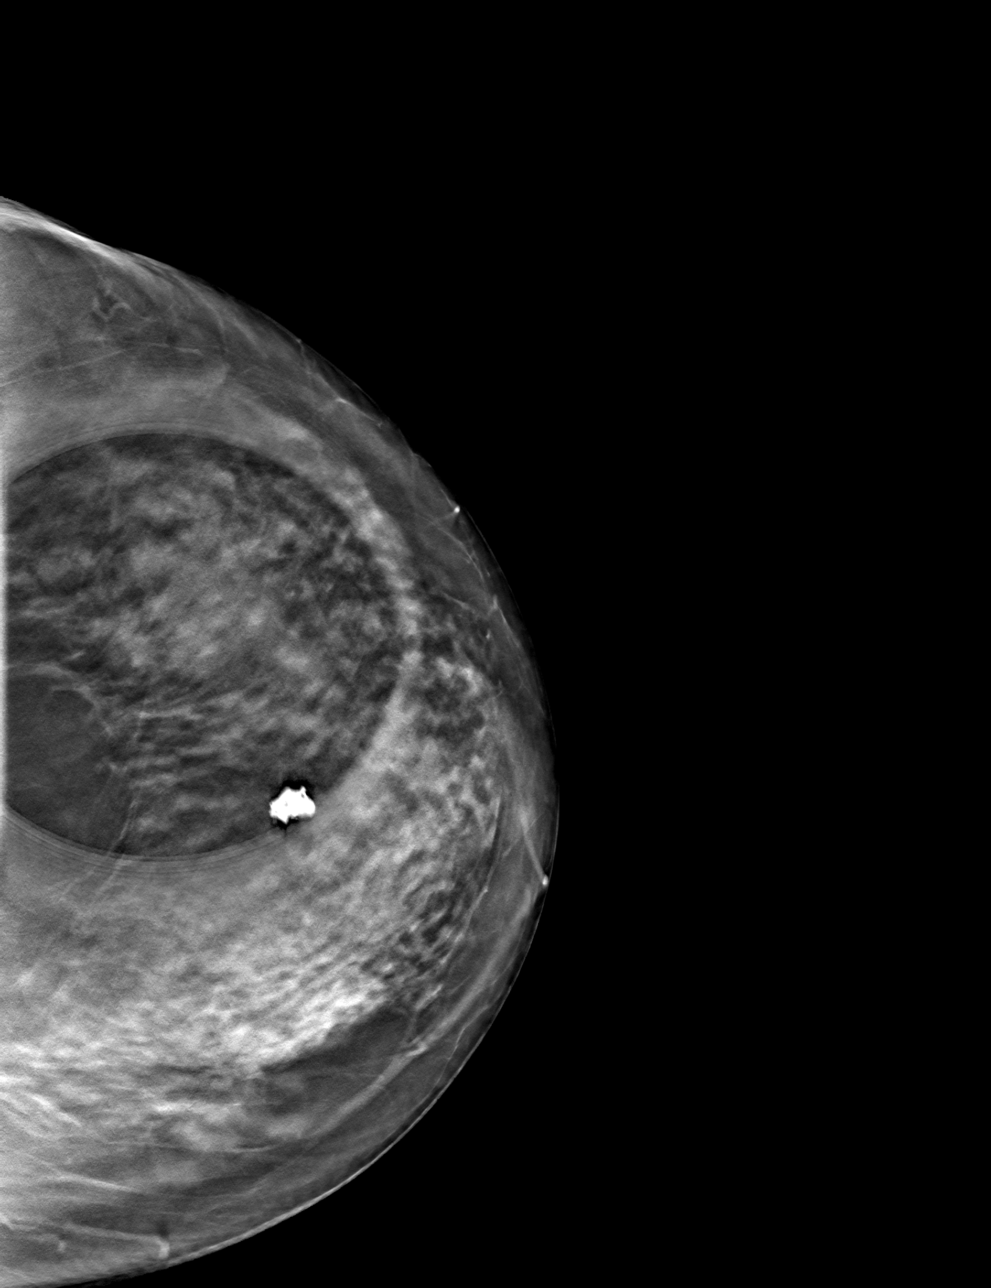

[L MLO tomo · tomo slice 37/72.0]
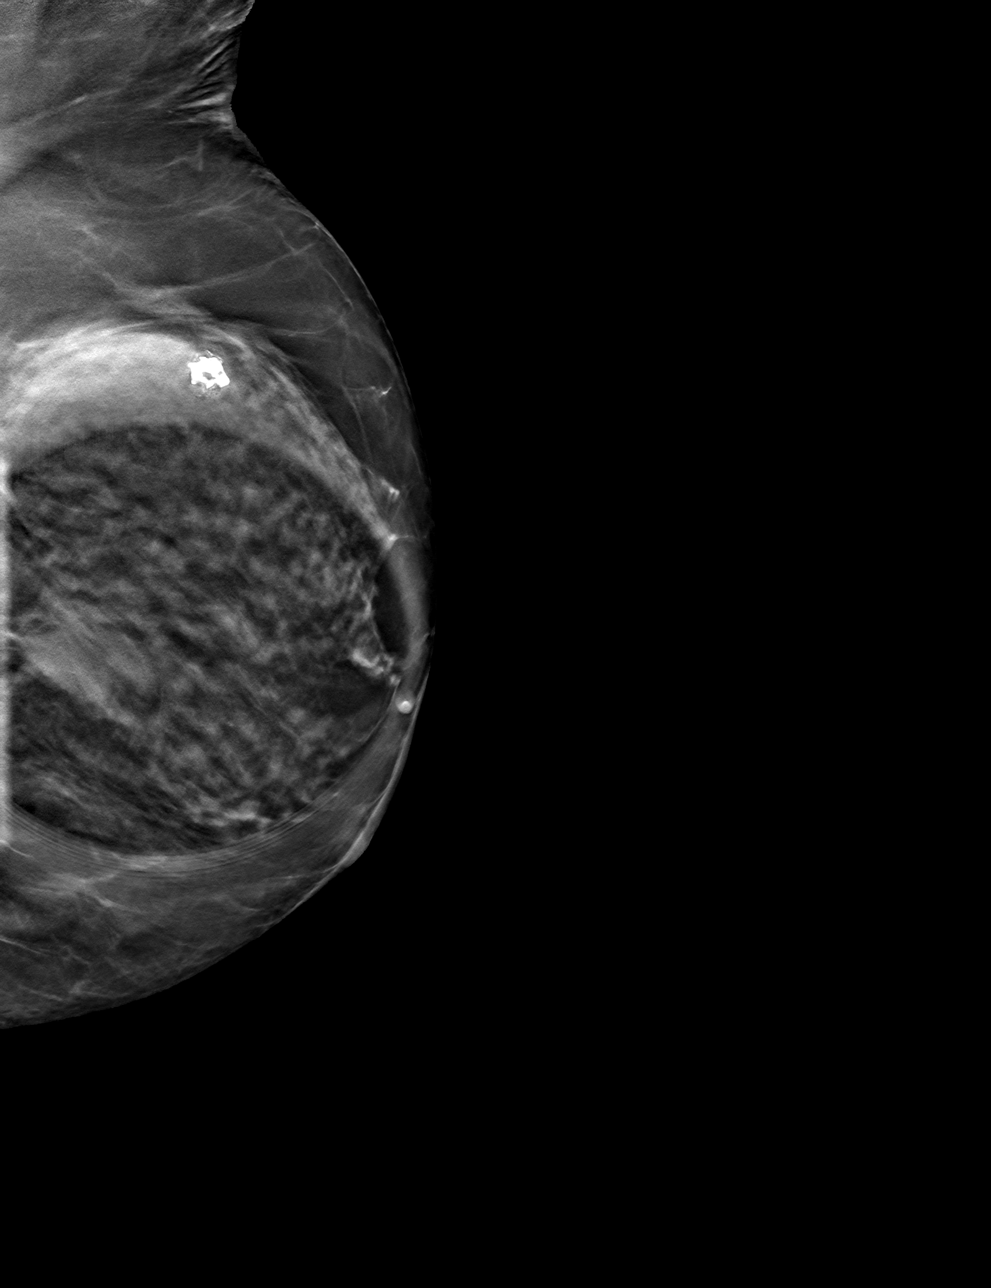

[4 of 12 positions shown; findings below may reference images not displayed]

ACR Breast Density Category c: The breast tissue is heterogeneously
dense, which may obscure small masses.
FINDINGS: Additional 2-D and 3-D images are performed. Spot compression views
confirm presence of a spiculated mass in the LATERAL aspect of the
LEFT breast.

Mammographic images were processed with CAD.

On physical exam, I palpate a discrete firm mass in the 3 o'clock
location of the LEFT breast.

Targeted ultrasound is performed, showing irregular hypoechoic mass
with irregular margins and posterior acoustic shadowing in the 3
o'clock location of the LEFT breast 5 centimeters from the nipple.
Mass is 2.8 x 2.2 x 2.8 centimeters. Evaluation of the LEFT axilla
is negative for adenopathy.
IMPRESSION: Suspicious mass in the 3 o'clock location of the LEFT breast
warranting biopsy.

RECOMMENDATION:
Ultrasound-guided core biopsy is recommended and scheduled for the
patient.

I have discussed the findings and recommendations with the patient.
Results were also provided in writing at the conclusion of the
visit. If applicable, a reminder letter will be sent to the patient
regarding the next appointment.

BI-RADS CATEGORY  4: Suspicious.

## 2019-03-11 DIAGNOSIS — L821 Other seborrheic keratosis: Secondary | ICD-10-CM | POA: Diagnosis not present

## 2019-03-11 DIAGNOSIS — L570 Actinic keratosis: Secondary | ICD-10-CM | POA: Diagnosis not present

## 2019-03-11 DIAGNOSIS — D2262 Melanocytic nevi of left upper limb, including shoulder: Secondary | ICD-10-CM | POA: Diagnosis not present

## 2019-03-11 DIAGNOSIS — C44519 Basal cell carcinoma of skin of other part of trunk: Secondary | ICD-10-CM | POA: Diagnosis not present

## 2019-03-11 DIAGNOSIS — D1801 Hemangioma of skin and subcutaneous tissue: Secondary | ICD-10-CM | POA: Diagnosis not present

## 2019-03-11 DIAGNOSIS — D2261 Melanocytic nevi of right upper limb, including shoulder: Secondary | ICD-10-CM | POA: Diagnosis not present

## 2019-03-11 DIAGNOSIS — D2271 Melanocytic nevi of right lower limb, including hip: Secondary | ICD-10-CM | POA: Diagnosis not present

## 2019-03-11 DIAGNOSIS — D2272 Melanocytic nevi of left lower limb, including hip: Secondary | ICD-10-CM | POA: Diagnosis not present

## 2019-03-11 DIAGNOSIS — L814 Other melanin hyperpigmentation: Secondary | ICD-10-CM | POA: Diagnosis not present

## 2019-03-11 DIAGNOSIS — D225 Melanocytic nevi of trunk: Secondary | ICD-10-CM | POA: Diagnosis not present

## 2019-03-19 DIAGNOSIS — Z85828 Personal history of other malignant neoplasm of skin: Secondary | ICD-10-CM | POA: Diagnosis not present

## 2019-03-19 DIAGNOSIS — C44519 Basal cell carcinoma of skin of other part of trunk: Secondary | ICD-10-CM | POA: Diagnosis not present

## 2019-06-19 DIAGNOSIS — R7301 Impaired fasting glucose: Secondary | ICD-10-CM | POA: Diagnosis not present

## 2019-06-19 DIAGNOSIS — Z0000 Encounter for general adult medical examination without abnormal findings: Secondary | ICD-10-CM | POA: Diagnosis not present

## 2019-06-19 DIAGNOSIS — E7849 Other hyperlipidemia: Secondary | ICD-10-CM | POA: Diagnosis not present

## 2019-06-19 DIAGNOSIS — E559 Vitamin D deficiency, unspecified: Secondary | ICD-10-CM | POA: Diagnosis not present

## 2019-06-19 DIAGNOSIS — I10 Essential (primary) hypertension: Secondary | ICD-10-CM | POA: Diagnosis not present

## 2019-06-20 DIAGNOSIS — I10 Essential (primary) hypertension: Secondary | ICD-10-CM | POA: Diagnosis not present

## 2019-06-20 DIAGNOSIS — R82998 Other abnormal findings in urine: Secondary | ICD-10-CM | POA: Diagnosis not present

## 2019-06-24 DIAGNOSIS — Z1331 Encounter for screening for depression: Secondary | ICD-10-CM | POA: Diagnosis not present

## 2019-06-24 DIAGNOSIS — C50412 Malignant neoplasm of upper-outer quadrant of left female breast: Secondary | ICD-10-CM | POA: Diagnosis not present

## 2019-06-24 DIAGNOSIS — M1990 Unspecified osteoarthritis, unspecified site: Secondary | ICD-10-CM | POA: Diagnosis not present

## 2019-06-24 DIAGNOSIS — Z0000 Encounter for general adult medical examination without abnormal findings: Secondary | ICD-10-CM | POA: Diagnosis not present

## 2019-06-24 DIAGNOSIS — N1832 Chronic kidney disease, stage 3b: Secondary | ICD-10-CM | POA: Diagnosis not present

## 2019-06-24 DIAGNOSIS — I10 Essential (primary) hypertension: Secondary | ICD-10-CM | POA: Diagnosis not present

## 2019-06-24 DIAGNOSIS — R7301 Impaired fasting glucose: Secondary | ICD-10-CM | POA: Diagnosis not present

## 2019-06-24 DIAGNOSIS — Z9012 Acquired absence of left breast and nipple: Secondary | ICD-10-CM | POA: Diagnosis not present

## 2019-06-24 DIAGNOSIS — Z1339 Encounter for screening examination for other mental health and behavioral disorders: Secondary | ICD-10-CM | POA: Diagnosis not present

## 2019-06-24 DIAGNOSIS — E785 Hyperlipidemia, unspecified: Secondary | ICD-10-CM | POA: Diagnosis not present

## 2019-10-21 ENCOUNTER — Ambulatory Visit (INDEPENDENT_AMBULATORY_CARE_PROVIDER_SITE_OTHER): Payer: Medicare Other | Admitting: Physician Assistant

## 2019-10-21 ENCOUNTER — Telehealth: Payer: Self-pay | Admitting: Radiology

## 2019-10-21 ENCOUNTER — Other Ambulatory Visit: Payer: Self-pay

## 2019-10-21 ENCOUNTER — Encounter: Payer: Self-pay | Admitting: Orthopaedic Surgery

## 2019-10-21 ENCOUNTER — Ambulatory Visit (INDEPENDENT_AMBULATORY_CARE_PROVIDER_SITE_OTHER): Payer: Medicare Other

## 2019-10-21 DIAGNOSIS — M1712 Unilateral primary osteoarthritis, left knee: Secondary | ICD-10-CM | POA: Insufficient documentation

## 2019-10-21 DIAGNOSIS — M25561 Pain in right knee: Secondary | ICD-10-CM | POA: Diagnosis not present

## 2019-10-21 DIAGNOSIS — G8929 Other chronic pain: Secondary | ICD-10-CM

## 2019-10-21 MED ORDER — LIDOCAINE HCL 1 % IJ SOLN
3.0000 mL | INTRAMUSCULAR | Status: AC | PRN
  Administered 2019-10-21: 3 mL

## 2019-10-21 MED ORDER — METHYLPREDNISOLONE ACETATE 40 MG/ML IJ SUSP
40.0000 mg | INTRAMUSCULAR | Status: AC | PRN
  Administered 2019-10-21: 40 mg via INTRA_ARTICULAR

## 2019-10-21 NOTE — Telephone Encounter (Signed)
Right knee supplemental injection 

## 2019-10-21 NOTE — Progress Notes (Signed)
Office Visit Note   Patient: Beth [name]           Date of Birth: [birth year]           MRN: [id number] Visit Date: 10/21/2019              Requested by: Burnard Bunting, MD [address] [place],  [place] [address] PCP: Burnard Bunting, MD   Assessment & Plan: Visit Diagnoses:  1. Chronic pain of right knee   2. Primary osteoarthritis of left knee     Plan: She will work on Forensic scientist.  We will try to gain approval for supplemental injection right knee.  Have her follow-up once this is available.  Questions were encouraged and answered  Follow-Up Instructions: Return for Supplemental injection.   Orders:  Orders Placed This Encounter  Procedures  . Large Joint Inj: R knee  . XR Knee 1-2 Views Right   No orders of the defined types were placed in this encounter.     Procedures: Large Joint Inj: R knee on 10/21/2019 4:21 PM Indications: pain Details: 22 G 1.5 in needle, anterolateral approach  Arthrogram: No  Medications: 3 mL lidocaine 1 %; 40 mg methylPREDNISolone acetate 40 MG/ML Outcome: tolerated well, no immediate complications Procedure, treatment alternatives, risks and benefits explained, specific risks discussed. Consent was given by the patient. Immediately prior to procedure a time out was called to verify the correct patient, procedure, equipment, support staff and site/side marked as required. Patient was prepped and draped in the usual sterile fashion.       Clinical Data: No additional findings.   Subjective: Chief Complaint  Patient presents with  . Right Knee - Pain    HPI Beth [name] 79 year old female well-known to Dr. Ninfa [name] service.  She has known tricompartmental arthritis right knee.  She unfortunately was diagnosed with breast cancer since she was last seen been dealing with that.  Now she is noticing her knee a little bit more in regards to pain.  She states it gives out on her.  She did have a Monovisc injection  08/14/2017 feels like it did help.  She is wondering what she can do about the knee at this point time.  Nondiabetic.  Covid vaccine second injection greater than 2 weeks ago. Review of Systems Negative for fevers chills shortness of breath.  Objective: Vital Signs: There were no vitals taken for this visit.  Physical Exam Pulmonary:     Effort: Pulmonary effort is normal.  Neurological:     Mental Status: She is alert and oriented to person, place, and time.  Psychiatric:        Mood and Affect: Mood normal.     Ortho Exam Right knee full range of motion.  No instability valgus varus stressing.  No significant tenderness with palpation along medial lateral joint line.  Positive patellofemoral crepitus. Specialty Comments:  No specialty comments available.  Imaging: XR Knee 1-2 Views Right  Result Date: 10/21/2019 Right knee 2 views: No acute fracture.  Tricompartmental arthritic changes with bone-on-bone medial compartment.  Knee is well located.  No bony abnormalities otherwise.    PMFS History: Patient Active Problem List   Diagnosis Date Noted  . Primary osteoarthritis of left knee 10/21/2019  . Breast cancer of upper-outer quadrant of left female breast ([place]) 12/11/2017  . Malignant neoplasm of upper-outer quadrant of left breast in female, estrogen receptor positive ([employer]) 10/13/2017  . Unilateral primary osteoarthritis, right knee 07/17/2017  . Chronic  pain of right knee 07/17/2017  . Cholelithiasis with chronic cholecystitis 03/20/2016   Past Medical History:  Diagnosis Date  . Arthritis    "right knee" (12/11/2017)  . Breast cancer, left breast ([place]) 09/2017  . Hypertension     Family History  Problem Relation Age of Onset  . Colon cancer Maternal Aunt   . Breast cancer Maternal Aunt   . Colon cancer Maternal Aunt     Past Surgical History:  Procedure Laterality Date  . BREAST BIOPSY Bilateral 1960s   EXCISIONAL BIOPSY  . BUNIONECTOMY Bilateral 1970s  .  CHOLECYSTECTOMY N/A 03/22/2016   Procedure: LAPAROSCOPIC CHOLECYSTECTOMY WITH INTRAOPERATIVE CHOLANGIOGRAM;  Surgeon: Armandina Gemma, MD;  Location: [facility];  Service: General;  Laterality: N/A;  . CYST EXCISION Right    palm/thumb-side  . EVACUATION BREAST HEMATOMA Left 02/19/2018   Procedure: DRAINAGE LEFT MASTECTOMY SITE;  Surgeon: [name], MD;  Location: [facility];  Service: General;  Laterality: Left;  Marland Kitchen MASTECTOMY COMPLETE / SIMPLE W/ SENTINEL NODE BIOPSY Left 12/11/2017   AXILLARY DEEP SENTINEL LYMPH NODE BIOPSY  . MASTECTOMY W/ SENTINEL NODE BIOPSY Left 12/11/2017   Procedure: INJECT BLUE DYE LEFT BREAST, LEFT TOTAL MASTECTOMY WITH WITH LEFT AXILLARY DEEP SENTINEL LYMPH NODE BIOPSY;  Surgeon: [name], MD;  Location: [facility];  Service: General;  Laterality: Left;  . TONSILLECTOMY     Social History   Occupational History  . Not on file  Tobacco Use  . Smoking status: Never Smoker  . Smokeless tobacco: Never Used  Substance and Sexual Activity  . Alcohol use: Yes    Alcohol/week: 2.0 standard drinks    Types: 2 Glasses of wine per week  . Drug use: Never  . Sexual activity: Not Currently

## 2019-10-21 NOTE — Telephone Encounter (Signed)
Noted  

## 2019-10-22 ENCOUNTER — Telehealth: Payer: Self-pay

## 2019-10-22 ENCOUNTER — Other Ambulatory Visit: Payer: Self-pay | Admitting: Internal Medicine

## 2019-10-22 DIAGNOSIS — Z1231 Encounter for screening mammogram for malignant neoplasm of breast: Secondary | ICD-10-CM

## 2019-10-22 NOTE — Telephone Encounter (Signed)
Submitted VOB for Monovisc, right knee. 

## 2019-10-23 ENCOUNTER — Telehealth: Payer: Self-pay

## 2019-10-23 NOTE — Telephone Encounter (Signed)
Patient aware that she is approved.  Approved for Monovisc, right knee Buy & Bill Secondary plan [facility]) will pick up eligible expenses at 100% and it covers Medicare Part B deductible. No Co-pay No PA required  Appt. 10/28/2019

## 2019-10-28 ENCOUNTER — Ambulatory Visit (INDEPENDENT_AMBULATORY_CARE_PROVIDER_SITE_OTHER): Payer: Medicare Other | Admitting: Physician Assistant

## 2019-10-28 ENCOUNTER — Encounter: Payer: Self-pay | Admitting: Orthopaedic Surgery

## 2019-10-28 ENCOUNTER — Other Ambulatory Visit: Payer: Self-pay

## 2019-10-28 DIAGNOSIS — M1712 Unilateral primary osteoarthritis, left knee: Secondary | ICD-10-CM | POA: Diagnosis not present

## 2019-10-28 MED ORDER — HYALURONAN 88 MG/4ML IX SOSY
88.0000 mg | PREFILLED_SYRINGE | INTRA_ARTICULAR | Status: AC | PRN
  Administered 2019-10-28: 88 mg via INTRA_ARTICULAR

## 2019-10-28 NOTE — Progress Notes (Signed)
   Procedure Note  Patient: Beth [name]             Date of Birth: April 20, 1941           MRN: [id number]             Visit Date: 10/28/2019 HPI: Beth [name] comes in today for right knee Monovisc injection.  She has had no new injury to the knee.  She states she has been having minimal pain but really has not with no a lot of activity in regards to her knee.  Physical exam: Right knee full extension full flexion no abnormal warmth erythema or effusion.  Procedures: Visit Diagnoses:  1. Primary osteoarthritis of left knee     Large Joint Inj: R knee on 10/28/2019 11:49 AM Indications: pain Details: 22 G 1.5 in needle, anterolateral approach  Arthrogram: No  Medications: 88 mg Hyaluronan 88 MG/4ML Outcome: tolerated well, no immediate complications Procedure, treatment alternatives, risks and benefits explained, specific risks discussed. Consent was given by the patient. Immediately prior to procedure a time out was called to verify the correct patient, procedure, equipment, support staff and site/side marked as required. Patient was prepped and draped in the usual sterile fashion.    Plan: She will take her Advil but she usually takes ice as needed.  Continue work on Hotel manager.  Follow-up with [place] no sooner than 6 months for repeat supplemental injection and she understands that she can have cortisone injections every 3 months if need be.  Questions were encouraged and answered.  Pamphlet on Monovisc was given.

## 2019-10-31 ENCOUNTER — Telehealth: Payer: Self-pay | Admitting: Orthopaedic Surgery

## 2019-10-31 NOTE — Telephone Encounter (Signed)
Is this probably forms? Should I send to [place]?

## 2019-10-31 NOTE — Telephone Encounter (Signed)
Alex from [facility] called.   He was following up on paperwork that was sent over on behalf of the patient. He said it was originally faxed on the 18th and again today.   Call back: [phone]

## 2019-10-31 NOTE — Telephone Encounter (Signed)
Received

## 2019-11-28 ENCOUNTER — Ambulatory Visit
Admission: RE | Admit: 2019-11-28 | Discharge: 2019-11-28 | Disposition: A | Discharge status: Home / Self Care | Payer: Medicare Other | Source: Ambulatory Visit | Attending: Internal Medicine | Admitting: Internal Medicine

## 2019-11-28 ENCOUNTER — Other Ambulatory Visit: Payer: Self-pay

## 2019-11-28 DIAGNOSIS — Z1231 Encounter for screening mammogram for malignant neoplasm of breast: Secondary | ICD-10-CM

## 2019-12-30 ENCOUNTER — Other Ambulatory Visit: Payer: Self-pay | Admitting: Hematology and Oncology

## 2020-01-15 DIAGNOSIS — Z961 Presence of intraocular lens: Secondary | ICD-10-CM | POA: Diagnosis not present

## 2020-01-15 DIAGNOSIS — H353132 Nonexudative age-related macular degeneration, bilateral, intermediate dry stage: Secondary | ICD-10-CM | POA: Diagnosis not present

## 2020-01-20 DIAGNOSIS — N1832 Chronic kidney disease, stage 3b: Secondary | ICD-10-CM | POA: Diagnosis not present

## 2020-01-20 DIAGNOSIS — R7301 Impaired fasting glucose: Secondary | ICD-10-CM | POA: Diagnosis not present

## 2020-01-20 DIAGNOSIS — I129 Hypertensive chronic kidney disease with stage 1 through stage 4 chronic kidney disease, or unspecified chronic kidney disease: Secondary | ICD-10-CM | POA: Diagnosis not present

## 2020-01-20 DIAGNOSIS — E785 Hyperlipidemia, unspecified: Secondary | ICD-10-CM | POA: Diagnosis not present

## 2020-01-20 DIAGNOSIS — M1990 Unspecified osteoarthritis, unspecified site: Secondary | ICD-10-CM | POA: Diagnosis not present

## 2020-01-20 DIAGNOSIS — C50412 Malignant neoplasm of upper-outer quadrant of left female breast: Secondary | ICD-10-CM | POA: Diagnosis not present

## 2020-01-20 NOTE — Progress Notes (Signed)
Patient Care Team: Burnard Bunting, MD as PCP - General (Internal Medicine) [name], MD as Consulting Physician (General Surgery) Nicholas Lose, MD as Consulting Physician (Hematology and Oncology) Eppie Gibson, MD as Attending Physician (Radiation Oncology) Gardenia Phlegm, NP as Nurse Practitioner (Hematology and Oncology)  DIAGNOSIS:    ICD-10-CM   1. Malignant neoplasm of upper-outer quadrant of left breast in female, estrogen receptor positive ([place])  C50.412    Z17.0     SUMMARY OF ONCOLOGIC HISTORY: Oncology History  Malignant neoplasm of upper-outer quadrant of left breast in female, estrogen receptor positive ([place])  10/09/2017 Initial Diagnosis   Screening detected left breast mass at 3 o'clock position 2.8 x 2.8 x 2.2 cm, lymph nodes negative, biopsy revealed grade 2 invasive lobular cancer ER 95%, PR 95%, Ki-67 2%, HER-2 negative ratio 1.34, T2N0 stage Ib clinical stage AJCC 8   12/11/2017 Surgery   Left mastectomy: Invasive lobular cancer, grade 1, 2 foci largest past 4.5 cm, 0/2 lymph nodes negative, margins negative, ER 95%, PR 95%, Ki-67 2%, HER-2 negative ratio 1.34, T2N0 stage Ib   12/27/2017 Cancer Staging   Staging form: Breast, AJCC 8th Edition - Pathologic: Stage IA (pT2, pN0, cM0, G1, ER+, PR+, HER2-) - Signed by Gardenia Phlegm, NP on 12/27/2017   12/2017 -  Anti-estrogen oral therapy   Anastrozole     CHIEF COMPLIANT: Follow-up of left breast cancer on anastrozole  INTERVAL HISTORY: Beth [name] is a 79 y.o. with above-mentioned history of left breast cancer treated with mastectomy and who is currently on anastrozole. Mammogram on 12/02/19 showed no evidence of malignancy bilaterally. She presents to the clinic today for annual follow-up.   ALLERGIES:  is allergic to aleve [naproxen sodium].  MEDICATIONS:  Current Outpatient Medications  Medication Sig Dispense Refill  . anastrozole (ARIMIDEX) 1 MG tablet TAKE ONE TABLET BY  MOUTH ONE TIME DAILY 90 tablet 0  . carboxymethylcellulose (REFRESH TEARS) 0.5 % SOLN Place 1 drop into both eyes 3 (three) times daily as needed (dry eyes).    . cholecalciferol (VITAMIN D) 1000 units tablet Take 1,000 Units by mouth daily with lunch.     Marland Kitchen glucosamine-chondroitin 500-400 MG tablet Take 1 tablet by mouth daily.    Marland Kitchen ibuprofen (ADVIL,MOTRIN) 200 MG tablet Take 600 mg by mouth daily as needed (for pain).    . Liniments (SALONPAS ARTHRITIS PAIN RELIEF EX) Place 1 patch onto the skin daily as needed (knee pain).     Marland Kitchen lisinopril-hydrochlorothiazide (PRINZIDE,ZESTORETIC) 20-12.5 MG tablet Take 1 tablet by mouth daily.  3  . Multiple Vitamins-Minerals (OCUVITE EXTRA PO) Take 1 tablet by mouth daily after lunch.     . Red Yeast Rice Extract 600 MG CAPS Take 600 mg by mouth daily after lunch.      No current facility-administered medications for this visit.    PHYSICAL EXAMINATION: ECOG PERFORMANCE STATUS: 1 - Symptomatic but completely ambulatory  Vitals:   01/21/20 1433  BP: (!) 139/57  Pulse: 84  Resp: 18  Temp: 98 F (36.7 C)  SpO2: 100%   Filed Weights   01/21/20 1433  Weight: 161 lb (73 kg)    BREAST: No palpable masses or nodules in either right or left breasts. No palpable axillary supraclavicular or infraclavicular adenopathy no breast tenderness or nipple discharge. (exam performed in the presence of a chaperone)  LABORATORY DATA:  I have reviewed the data as listed CMP Latest Ref Rng & Units 02/16/2018 12/11/2017 12/04/2017  Glucose  70 - 99 mg/dL 204(H) - 113(H)  BUN 8 - 23 mg/dL 15 - 28(H)  Creatinine 0.44 - 1.00 mg/dL 1.13(H) 1.11(H) 1.53(H)  Sodium 135 - 145 mmol/L 140 - 136  Potassium 3.5 - 5.1 mmol/L 4.1 - 4.1  Chloride 98 - 111 mmol/L 108 - 100(L)  CO2 22 - 32 mmol/L 22 - 24  Calcium 8.9 - 10.3 mg/dL 9.3 - 9.1  Total Protein 6.5 - 8.1 g/dL - - 6.8  Total Bilirubin 0.3 - 1.2 mg/dL - - 0.6  Alkaline Phos 38 - 126 U/L - - 70  AST 15 - 41 U/L - - 20    ALT 14 - 54 U/L - - 14    Lab Results  Component Value Date   WBC 11.4 (H) 12/11/2017   HGB 10.8 (L) 12/11/2017   HCT 32.6 (L) 12/11/2017   MCV 93.9 12/11/2017   PLT 186 12/11/2017   NEUTROABS 5.0 12/04/2017    ASSESSMENT & PLAN:  Malignant neoplasm of upper-outer quadrant of left breast in female, estrogen receptor positive ([place]) 12/11/2017:Left mastectomy: Invasive lobular cancer, grade 1, 2 foci largest past 4.5 cm, 0/2 lymph nodes negative, margins negative, ER 95%, PR 95%, Ki-67 2%, HER-2 negative ratio 1.34, T2N0 stage Ib  Treatment plan: No role of adjuvant radiation because she had mastectomy. Adjuvant antiestrogen therapy withanastrozole2.5 mg daily x5 years started 12/18/2017  Anastrozole toxicities: 1. Occ Hot flashes 2. Muscle aches 3. Mood swings  Breast cancer surveillance: Mammogram has been ordered  12/02/2019: Benign breast density category D  Return to clinic in 1 year for follow-up     No orders of the defined types were placed in this encounter.  The patient has a good understanding of the overall plan. she agrees with it. she will call with any problems that may develop before the next visit here.  Total time spent: 20 mins including face to face time and time spent for planning, charting and coordination of care  Nicholas Lose, MD 01/21/2020  I, Cloyde Reams Dorshimer, am acting as scribe for Dr. Nicholas Lose.  I have reviewed the above documentation for accuracy and completeness, and I agree with the above.

## 2020-01-21 ENCOUNTER — Telehealth: Payer: Self-pay | Admitting: Hematology and Oncology

## 2020-01-21 ENCOUNTER — Other Ambulatory Visit: Payer: Self-pay

## 2020-01-21 ENCOUNTER — Inpatient Hospital Stay: Payer: Medicare Other | Attending: Hematology and Oncology | Admitting: Hematology and Oncology

## 2020-01-21 DIAGNOSIS — Z79811 Long term (current) use of aromatase inhibitors: Secondary | ICD-10-CM | POA: Diagnosis not present

## 2020-01-21 DIAGNOSIS — Z79899 Other long term (current) drug therapy: Secondary | ICD-10-CM | POA: Diagnosis not present

## 2020-01-21 DIAGNOSIS — C50412 Malignant neoplasm of upper-outer quadrant of left female breast: Secondary | ICD-10-CM

## 2020-01-21 DIAGNOSIS — Z170 Estrogen receptor positive status [ER+]: Secondary | ICD-10-CM | POA: Diagnosis not present

## 2020-01-21 DIAGNOSIS — Z9012 Acquired absence of left breast and nipple: Secondary | ICD-10-CM | POA: Insufficient documentation

## 2020-01-21 MED ORDER — ANASTROZOLE 1 MG PO TABS: 1.0000 mg | ORAL_TABLET | Freq: Every day | ORAL | 3 refills | Status: DC

## 2020-01-21 NOTE — Assessment & Plan Note (Signed)
12/11/2017:Left mastectomy: Invasive lobular cancer, grade 1, 2 foci largest past 4.5 cm, 0/2 lymph nodes negative, margins negative, ER 95%, PR 95%, Ki-67 2%, HER-2 negative ratio 1.34, T2N0 stage Ib  Treatment plan: No role of adjuvant radiation because she had mastectomy. Adjuvant antiestrogen therapy withanastrozole2.5 mg daily x5 years started 12/18/2017  Anastrozole toxicities: 1. Occ Hot flashes 2. Muscle aches 3. Mood swings  Breast cancer surveillance: Mammogram has been ordered  12/02/2019: Benign breast density category D  Return to clinic in 1 year for follow-up  

## 2020-01-21 NOTE — Telephone Encounter (Signed)
Scheduled appts per 8/10 los. Gave pt a print out of AVS.  

## 2020-01-28 DIAGNOSIS — H9113 Presbycusis, bilateral: Secondary | ICD-10-CM | POA: Diagnosis not present

## 2020-01-28 DIAGNOSIS — H903 Sensorineural hearing loss, bilateral: Secondary | ICD-10-CM | POA: Diagnosis not present

## 2020-03-06 DIAGNOSIS — Z23 Encounter for immunization: Secondary | ICD-10-CM | POA: Diagnosis not present

## 2020-03-30 DIAGNOSIS — D2261 Melanocytic nevi of right upper limb, including shoulder: Secondary | ICD-10-CM | POA: Diagnosis not present

## 2020-03-30 DIAGNOSIS — D225 Melanocytic nevi of trunk: Secondary | ICD-10-CM | POA: Diagnosis not present

## 2020-03-30 DIAGNOSIS — D2262 Melanocytic nevi of left upper limb, including shoulder: Secondary | ICD-10-CM | POA: Diagnosis not present

## 2020-03-30 DIAGNOSIS — L814 Other melanin hyperpigmentation: Secondary | ICD-10-CM | POA: Diagnosis not present

## 2020-03-30 DIAGNOSIS — Z85828 Personal history of other malignant neoplasm of skin: Secondary | ICD-10-CM | POA: Diagnosis not present

## 2020-03-30 DIAGNOSIS — D485 Neoplasm of uncertain behavior of skin: Secondary | ICD-10-CM | POA: Diagnosis not present

## 2020-03-30 DIAGNOSIS — L570 Actinic keratosis: Secondary | ICD-10-CM | POA: Diagnosis not present

## 2020-03-30 DIAGNOSIS — D2272 Melanocytic nevi of left lower limb, including hip: Secondary | ICD-10-CM | POA: Diagnosis not present

## 2020-03-30 DIAGNOSIS — L820 Inflamed seborrheic keratosis: Secondary | ICD-10-CM | POA: Diagnosis not present

## 2020-03-30 DIAGNOSIS — L821 Other seborrheic keratosis: Secondary | ICD-10-CM | POA: Diagnosis not present

## 2020-04-30 ENCOUNTER — Encounter: Payer: Self-pay | Admitting: Orthopaedic Surgery

## 2020-04-30 ENCOUNTER — Ambulatory Visit (INDEPENDENT_AMBULATORY_CARE_PROVIDER_SITE_OTHER): Payer: Medicare Other | Admitting: Orthopaedic Surgery

## 2020-04-30 DIAGNOSIS — M1711 Unilateral primary osteoarthritis, right knee: Secondary | ICD-10-CM | POA: Diagnosis not present

## 2020-04-30 MED ORDER — HYALURONAN 88 MG/4ML IX SOSY
88.0000 mg | PREFILLED_SYRINGE | INTRA_ARTICULAR | Status: AC | PRN
  Administered 2020-04-30: 88 mg via INTRA_ARTICULAR

## 2020-04-30 NOTE — Progress Notes (Signed)
   Procedure Note  Patient: Beth [name]             Date of Birth: 1941-05-05           MRN: [id number]             Visit Date: 04/30/2020  Procedures: Visit Diagnoses:  1. Unilateral primary osteoarthritis, right knee     Large Joint Inj: R knee on 04/30/2020 3:45 PM Indications: diagnostic evaluation and pain Details: 22 G 1.5 in needle, superolateral approach  Arthrogram: No  Medications: 88 mg Hyaluronan 88 MG/4ML Outcome: tolerated well, no immediate complications Procedure, treatment alternatives, risks and benefits explained, specific risks discussed. Consent was given by the patient. Immediately prior to procedure a time out was called to verify the correct patient, procedure, equipment, support staff and site/side marked as required. Patient was prepped and draped in the usual sterile fashion.    The patient is here today for scheduled hyaluronic acid injection with Monovisc in her right knee to treat the pain from osteoarthritis.  This is what is helped in the past for her.  She is 79 years old and very active.  She actually bowls.  She does wear a knee sleeve on occasion on the right knee.  She has had no acute change in her medical status.  She has known osteoarthritis of the right knee and Monovisc is helped in the past.  She does have slight varus malalignment of her right knee today with medial lateral joint line tenderness and patellofemoral crepitation.  I did place Monovisc in her right knee today without difficulty.  All questions and concerns were answered and addressed.  She knows to wait at least 6 months between these injections.  Follow-up is as needed.

## 2020-07-15 DIAGNOSIS — R7301 Impaired fasting glucose: Secondary | ICD-10-CM | POA: Diagnosis not present

## 2020-07-15 DIAGNOSIS — E785 Hyperlipidemia, unspecified: Secondary | ICD-10-CM | POA: Diagnosis not present

## 2020-07-15 DIAGNOSIS — E559 Vitamin D deficiency, unspecified: Secondary | ICD-10-CM | POA: Diagnosis not present

## 2020-07-22 DIAGNOSIS — E663 Overweight: Secondary | ICD-10-CM | POA: Diagnosis not present

## 2020-07-22 DIAGNOSIS — N1832 Chronic kidney disease, stage 3b: Secondary | ICD-10-CM | POA: Diagnosis not present

## 2020-07-22 DIAGNOSIS — E785 Hyperlipidemia, unspecified: Secondary | ICD-10-CM | POA: Diagnosis not present

## 2020-07-22 DIAGNOSIS — R82998 Other abnormal findings in urine: Secondary | ICD-10-CM | POA: Diagnosis not present

## 2020-07-22 DIAGNOSIS — K921 Melena: Secondary | ICD-10-CM | POA: Diagnosis not present

## 2020-07-22 DIAGNOSIS — R7301 Impaired fasting glucose: Secondary | ICD-10-CM | POA: Diagnosis not present

## 2020-07-22 DIAGNOSIS — Z0000 Encounter for general adult medical examination without abnormal findings: Secondary | ICD-10-CM | POA: Diagnosis not present

## 2020-07-22 DIAGNOSIS — I10 Essential (primary) hypertension: Secondary | ICD-10-CM | POA: Diagnosis not present

## 2020-07-30 DIAGNOSIS — K921 Melena: Secondary | ICD-10-CM | POA: Diagnosis not present

## 2020-08-19 DIAGNOSIS — J069 Acute upper respiratory infection, unspecified: Secondary | ICD-10-CM | POA: Diagnosis not present

## 2020-08-19 DIAGNOSIS — R059 Cough, unspecified: Secondary | ICD-10-CM | POA: Diagnosis not present

## 2020-09-09 ENCOUNTER — Encounter: Payer: Self-pay | Admitting: Nurse Practitioner

## 2020-09-09 ENCOUNTER — Ambulatory Visit (INDEPENDENT_AMBULATORY_CARE_PROVIDER_SITE_OTHER): Payer: Medicare Other | Admitting: Nurse Practitioner

## 2020-09-09 VITALS — BP 130/68 | HR 87 | Wt 152.0 lb

## 2020-09-09 DIAGNOSIS — R195 Other fecal abnormalities: Secondary | ICD-10-CM | POA: Diagnosis not present

## 2020-09-09 MED ORDER — SUPREP BOWEL PREP KIT 17.5-3.13-1.6 GM/177ML PO SOLN: 1.0000 | ORAL | 0 refills | Status: DC

## 2020-09-09 NOTE — Progress Notes (Signed)
ASSESSMENT AND PLAN     # 80 year old female referred for a positive Hemosure.  No overt bleeding or alarm features.  Screening colonoscopies were discontinued at time of patient's last colonoscopy in November 2017.  However, she is willing and seems physically able to undergo a colonoscopy. We discussed that the Hemosure may have been falsely positive but we cannot exclude development of an interval colon lesion.  --Patient will be scheduled for a colonoscopy. The risks and benefits of colonoscopy with possible polypectomy / biopsies were discussed and the patient agrees to proceed.   # History of breast cancer diagnosed 2019.  On Arimidex  HISTORY OF PRESENT ILLNESS     Chief Complaint : positive hemosure  Beth [name] is a 80 y.o. female known to Dr. [name] with a past medical history significant for breast cancer in 2019, osteoarthritis, HTN, diverticulosis  Patient referred by PCP for a positive Hemosure.  She hasn't had any overt Gi bleeding. She has occasional mild constipation wiith decreased frequency of bowel movements but otherwise no bowel changes.. She hasn't had any abdominal pain or unexplained weight loss.  Patient had an aunt with colon cancer in her 41s, no other family history of colon cancer  PREVIOUS ENDOSCOPIC EVALUATIONS   November 2017 screening colonoscopy --Complete exam, excellent bowel prep --Diverticulosis, --Internal hemorrhoids   Past Medical History:  Diagnosis Date  . Arthritis    "right knee" (12/11/2017)  . Breast cancer ([place]) 2019   Left Breast Cancer  . Breast cancer, left breast ([place]) 09/2017  . Hypertension      Past Surgical History:  Procedure Laterality Date  . BREAST BIOPSY Bilateral 1960s   EXCISIONAL BIOPSY  . BUNIONECTOMY Bilateral 1970s  . CHOLECYSTECTOMY N/A 03/22/2016   Procedure: LAPAROSCOPIC CHOLECYSTECTOMY WITH INTRAOPERATIVE CHOLANGIOGRAM;  Surgeon: Armandina Gemma, MD;  Location: [facility];  Service: General;   Laterality: N/A;  . CYST EXCISION Right    palm/thumb-side  . EVACUATION BREAST HEMATOMA Left 02/19/2018   Procedure: DRAINAGE LEFT MASTECTOMY SITE;  Surgeon: [name], MD;  Location: [facility];  Service: General;  Laterality: Left;  Marland Kitchen MASTECTOMY COMPLETE / SIMPLE W/ SENTINEL NODE BIOPSY Left 12/11/2017   AXILLARY DEEP SENTINEL LYMPH NODE BIOPSY  . MASTECTOMY W/ SENTINEL NODE BIOPSY Left 12/11/2017   Procedure: INJECT BLUE DYE LEFT BREAST, LEFT TOTAL MASTECTOMY WITH WITH LEFT AXILLARY DEEP SENTINEL LYMPH NODE BIOPSY;  Surgeon: [name], MD;  Location: [facility];  Service: General;  Laterality: Left;  . TONSILLECTOMY     Family History  Problem Relation Age of Onset  . Colon cancer Maternal Aunt   . Breast cancer Maternal Aunt   . Colon cancer Maternal Aunt    Social History   Tobacco Use  . Smoking status: Never Smoker  . Smokeless tobacco: Never Used  Vaping Use  . Vaping Use: Never used  Substance Use Topics  . Alcohol use: Yes    Alcohol/week: 2.0 standard drinks    Types: 2 Glasses of wine per week  . Drug use: Never   Current Outpatient Medications  Medication Sig Dispense Refill  . anastrozole (ARIMIDEX) 1 MG tablet Take 1 tablet (1 mg total) by mouth daily. 90 tablet 3  . carboxymethylcellulose (REFRESH TEARS) 0.5 % SOLN Place 1 drop into both eyes 3 (three) times daily as needed (dry eyes).    . cholecalciferol (VITAMIN D) 1000 units tablet Take 1,000 Units by mouth daily with lunch.     Marland Kitchen  glucosamine-chondroitin 500-400 MG tablet Take 1 tablet by mouth daily.    Marland Kitchen ibuprofen (ADVIL,MOTRIN) 200 MG tablet Take 600 mg by mouth daily as needed (for pain).    . Liniments (SALONPAS ARTHRITIS PAIN RELIEF EX) Place 1 patch onto the skin daily as needed (knee pain).     Marland Kitchen lisinopril-hydrochlorothiazide (PRINZIDE,ZESTORETIC) 20-12.5 MG tablet Take 1 tablet by mouth daily.  3  . Multiple Vitamins-Minerals (OCUVITE EXTRA PO) Take 1 tablet by mouth daily after  lunch.     . Red Yeast Rice Extract 600 MG CAPS Take 600 mg by mouth daily after lunch.      No current facility-administered medications for this visit.   Allergies  Allergen Reactions  . Aleve [Naproxen Sodium] Itching     Review of Systems: All other systems reviewed and negative except where noted in HPI.   PHYSICAL EXAM :    Wt Readings from Last 3 Encounters:  09/09/20 152 lb (68.9 kg)  01/21/20 161 lb (73 kg)  04/02/18 159 lb 11.2 oz (72.4 kg)    BP 130/68   Pulse 87   Wt 152 lb (68.9 kg)   BMI 27.80 kg/m  Constitutional:  Pleasant well-developed female in no acute distress. Psychiatric: Normal mood and affect. Behavior is normal. EENT: Pupils normal.  Conjunctivae are normal. No scleral icterus. Neck supple.  Cardiovascular: Normal rate, regular rhythm. No edema Pulmonary/chest: Effort normal and breath sounds normal. No wheezing, rales or rhonchi. Abdominal: Soft, nondistended, nontender. Bowel sounds active throughout. There are no masses palpable. No hepatomegaly. Neurological: Alert and oriented to person place and time. Skin: Skin is warm and dry. No rashes noted.  Tye Savoy, NP  09/09/2020, 2:59 PM  Cc:  Referring Provider Burnard Bunting, MD

## 2020-09-09 NOTE — Patient Instructions (Addendum)
If you are age 82 or older, your body mass index should be between 23-30. Your Body mass index is 27.8 kg/m. If this is out of the aforementioned range listed, please consider follow up with your Primary Care Provider.  PROCEDURES: You have been scheduled for a colonoscopy. Please follow the written instructions given to you at your visit today. Please pick up your prep supplies at the pharmacy within the next 1-3 days. If you use inhalers (even only as needed), please bring them with you on the day of your procedure.  It was great seeing you today! Thank you for entrusting me with your care and choosing [facility].  Tye Savoy, NP

## 2020-09-10 NOTE — Progress Notes (Signed)
Assessment and plan reviewed.  Case discussed with GI nurse practitioner

## 2020-09-23 ENCOUNTER — Encounter: Payer: Self-pay | Admitting: Internal Medicine

## 2020-09-23 ENCOUNTER — Other Ambulatory Visit: Payer: Self-pay

## 2020-09-23 ENCOUNTER — Ambulatory Visit (AMBULATORY_SURGERY_CENTER): Payer: Medicare Other | Admitting: Internal Medicine

## 2020-09-23 VITALS — BP 153/60 | HR 61 | Temp 97.8°F | Resp 18 | Ht 62.0 in | Wt 152.0 lb

## 2020-09-23 DIAGNOSIS — K5730 Diverticulosis of large intestine without perforation or abscess without bleeding: Secondary | ICD-10-CM

## 2020-09-23 DIAGNOSIS — R195 Other fecal abnormalities: Secondary | ICD-10-CM

## 2020-09-23 DIAGNOSIS — Z1211 Encounter for screening for malignant neoplasm of colon: Secondary | ICD-10-CM | POA: Diagnosis not present

## 2020-09-23 MED ORDER — SODIUM CHLORIDE 0.9 % IV SOLN: 500.0000 mL | Freq: Once | INTRAVENOUS | Status: DC

## 2020-09-23 NOTE — Patient Instructions (Signed)
YOU HAD AN ENDOSCOPIC PROCEDURE TODAY AT THE [facility]:   Refer to the procedure report that was given to you for any specific questions about what was found during the examination.  If the procedure report does not answer your questions, please call your gastroenterologist to clarify.  If you requested that your care partner not be given the details of your procedure findings, then the procedure report has been included in a sealed envelope for you to review at your convenience later.  YOU SHOULD EXPECT: Some feelings of bloating in the abdomen. Passage of more gas than usual.  Walking can help get rid of the air that was put into your GI tract during the procedure and reduce the bloating. If you had a lower endoscopy (such as a colonoscopy or flexible sigmoidoscopy) you may notice spotting of blood in your stool or on the toilet paper. If you underwent a bowel prep for your procedure, you may not have a normal bowel movement for a few days.  Please Note:  You might notice some irritation and congestion in your nose or some drainage.  This is from the oxygen used during your procedure.  There is no need for concern and it should clear up in a day or so.  SYMPTOMS TO REPORT IMMEDIATELY:   Following lower endoscopy (colonoscopy or flexible sigmoidoscopy):  Excessive amounts of blood in the stool  Significant tenderness or worsening of abdominal pains  Swelling of the abdomen that is new, acute  Fever of 100F or higher   Following upper endoscopy (EGD)  Vomiting of blood or coffee ground material  New chest pain or pain under the shoulder blades  Painful or persistently difficult swallowing  New shortness of breath  Fever of 100F or higher  Black, tarry-looking stools  For urgent or emergent issues, a gastroenterologist can be reached at any hour by calling [phone]. Do not use MyChart messaging for urgent concerns.    DIET:  We do recommend a small meal at first, but  then you may proceed to your regular diet.  Drink plenty of fluids but you should avoid alcoholic beverages for 24 hours.  ACTIVITY:  You should plan to take it easy for the rest of today and you should NOT DRIVE or use heavy machinery until tomorrow (because of the sedation medicines used during the test).    FOLLOW UP: Our staff will call the number listed on your records 48-72 hours following your procedure to check on you and address any questions or concerns that you may have regarding the information given to you following your procedure. If we do not reach you, we will leave a message.  We will attempt to reach you two times.  During this call, we will ask if you have developed any symptoms of COVID 19. If you develop any symptoms (ie: fever, flu-like symptoms, shortness of breath, cough etc.) before then, please call [phone].  If you test positive for Covid 19 in the 2 weeks post procedure, please call and report this information to us.    If any biopsies were taken you will be contacted by phone or by letter within the next 1-3 weeks.  Please call us at [phone] if you have not heard about the biopsies in 3 weeks.    SIGNATURES/CONFIDENTIALITY: You and/or your care partner have signed paperwork which will be entered into your electronic medical record.  These signatures attest to the fact that that the information above on   your After Visit Summary has been reviewed and is understood.  Full responsibility of the confidentiality of this discharge information lies with you and/or your care-partner. 

## 2020-09-23 NOTE — Progress Notes (Signed)
A and O x3. Report to RN. Tolerated MAC anesthesia well.

## 2020-09-23 NOTE — Op Note (Signed)
[facility] Patient Name: Beth [name] Procedure Date: 09/23/2020 2:02 PM MRN: [id number] Endoscopist: Docia Chuck. [name] , MD Age: 80 Referring MD:  Date of Birth: 12/03/1940 Gender: Female Account #: [id number] Procedure:                Colonoscopy Indications:              Heme positive stool Medicines:                Monitored Anesthesia Care Procedure:                Pre-Anesthesia Assessment:                           - Prior to the procedure, a History and Physical                            was performed, and patient medications and                            allergies were reviewed. The patient's tolerance of                            previous anesthesia was also reviewed. The risks                            and benefits of the procedure and the sedation                            options and risks were discussed with the patient.                            All questions were answered, and informed consent                            was obtained. Prior Anticoagulants: The patient has                            taken no previous anticoagulant or antiplatelet                            agents. ASA Grade Assessment: II - A patient with                            mild systemic disease. After reviewing the risks                            and benefits, the patient was deemed in                            satisfactory condition to undergo the procedure.                           After obtaining informed consent, the colonoscope  was passed under direct vision. Throughout the                            procedure, the patient's blood pressure, pulse, and                            oxygen saturations were monitored continuously. The                            Colonoscope was introduced through the anus and                            advanced to the the cecum, identified by                            appendiceal orifice and ileocecal valve. The                             ileocecal valve, appendiceal orifice, and rectum                            were photographed. The quality of the bowel                            preparation was excellent. The colonoscopy was                            performed without difficulty. The patient tolerated                            the procedure well. The bowel preparation used was                            SUPREP via split dose instruction. Scope In: 2:15:42 PM Scope Out: 2:36:43 PM Scope Withdrawal Time: 0 hours 10 minutes 51 seconds  Total Procedure Duration: 0 hours 21 minutes 1 second  Findings:                 Multiple small and large-mouthed diverticula were                            found in the entire colon. The colon was redundant.                           The exam was otherwise without abnormality on                            direct and retroflexion views. Complications:            No immediate complications. Estimated blood loss:                            None. Estimated Blood Loss:     Estimated blood loss: none. Impression:               -  Diverticulosis in the entire examined colon. The                            colon was redundant.                           - The examination was otherwise normal on direct                            and retroflexion views.                           - No specimens collected. Recommendation:           - Repeat colonoscopy is not recommended for                            surveillance. This patient has completed a colon                            cancer surveillance program. She should NOT be                            completing screening Hemoccult testing moving                            forward.                           - Patient has a contact number available for                            emergencies. The signs and symptoms of potential                            delayed complications were discussed with the                             patient. Return to normal activities tomorrow.                            Written discharge instructions were provided to the                            patient.                           - Resume previous diet.                           - Continue present medications. Docia Chuck. [name], MD 09/23/2020 2:42:49 PM This report has been signed electronically.

## 2020-09-28 ENCOUNTER — Telehealth: Payer: Self-pay | Admitting: *Deleted

## 2020-09-28 NOTE — Telephone Encounter (Signed)
  Follow up Call-  Call back number 09/23/2020  Post procedure Call Back phone  # [phone]  Permission to leave phone message Yes  Some recent data might be hidden    LMOM to call back with any questions or concerns.  Also, call back if patient has developed fever, respiratory issues or been dx with COVID or had any family members or close contacts diagnosed since her procedure.

## 2020-09-28 NOTE — Telephone Encounter (Signed)
Attempted f/u phone call. No answer. Left message. °

## 2020-10-22 ENCOUNTER — Other Ambulatory Visit: Payer: Self-pay | Admitting: Internal Medicine

## 2020-10-22 DIAGNOSIS — Z1231 Encounter for screening mammogram for malignant neoplasm of breast: Secondary | ICD-10-CM

## 2020-12-01 DIAGNOSIS — Z20822 Contact with and (suspected) exposure to covid-19: Secondary | ICD-10-CM | POA: Diagnosis not present

## 2020-12-17 ENCOUNTER — Other Ambulatory Visit: Payer: Self-pay

## 2020-12-17 ENCOUNTER — Ambulatory Visit
Admission: RE | Admit: 2020-12-17 | Discharge: 2020-12-17 | Disposition: A | Discharge status: Home / Self Care | Payer: Medicare Other | Source: Ambulatory Visit | Attending: Internal Medicine | Admitting: Internal Medicine

## 2020-12-17 DIAGNOSIS — Z1231 Encounter for screening mammogram for malignant neoplasm of breast: Secondary | ICD-10-CM

## 2021-01-01 ENCOUNTER — Other Ambulatory Visit: Payer: Self-pay | Admitting: Hematology and Oncology

## 2021-01-20 NOTE — Progress Notes (Signed)
Patient Care Team: Burnard Bunting, MD as PCP - General (Internal Medicine) [name], MD as Consulting Physician (General Surgery) Nicholas Lose, MD as Consulting Physician (Hematology and Oncology) Eppie Gibson, MD as Attending Physician (Radiation Oncology) Gardenia Phlegm, NP as Nurse Practitioner (Hematology and Oncology)  DIAGNOSIS:    ICD-10-CM   1. Malignant neoplasm of upper-outer quadrant of left breast in female, estrogen receptor positive ([place])  C50.412    Z17.0       SUMMARY OF ONCOLOGIC HISTORY: Oncology History  Malignant neoplasm of upper-outer quadrant of left breast in female, estrogen receptor positive ([place])  10/09/2017 Initial Diagnosis   Screening detected left breast mass at 3 o'clock position 2.8 x 2.8 x 2.2 cm, lymph nodes negative, biopsy revealed grade 2 invasive lobular cancer ER 95%, PR 95%, Ki-67 2%, HER-2 negative ratio 1.34, T2N0 stage Ib clinical stage AJCC 8   12/11/2017 Surgery   Left mastectomy: Invasive lobular cancer, grade 1, 2 foci largest past 4.5 cm, 0/2 lymph nodes negative, margins negative, ER 95%, PR 95%, Ki-67 2%, HER-2 negative ratio 1.34, T2N0 stage Ib   12/27/2017 Cancer Staging   Staging form: Breast, AJCC 8th Edition - Pathologic: Stage IA (pT2, pN0, cM0, G1, ER+, PR+, HER2-) - Signed by Gardenia Phlegm, NP on 12/27/2017   12/2017 -  Anti-estrogen oral therapy   Anastrozole     CHIEF COMPLIANT: Follow-up of left breast cancer on anastrozole  INTERVAL HISTORY: Beth [name] is a 80 y.o. with above-mentioned history of left breast cancer treated with mastectomy and who is currently on anastrozole. Mammogram on 12/17/20 showed no evidence of malignancy bilaterally. Beth [name] presents to the clinic today for annual follow-up.   ALLERGIES:  is allergic to aleve [naproxen sodium].  MEDICATIONS:  Current Outpatient Medications  Medication Sig Dispense Refill   anastrozole (ARIMIDEX) 1 MG tablet TAKE ONE TABLET  BY MOUTH ONE TIME DAILY 90 tablet 0   carboxymethylcellulose (REFRESH PLUS) 0.5 % SOLN Place 1 drop into both eyes 3 (three) times daily as needed (dry eyes).     cholecalciferol (VITAMIN D) 1000 units tablet Take 1,000 Units by mouth daily with lunch.      glucosamine-chondroitin 500-400 MG tablet Take 1 tablet by mouth daily. (Patient not taking: Reported on 09/23/2020)     ibuprofen (ADVIL,MOTRIN) 200 MG tablet Take 600 mg by mouth daily as needed (for pain).     Liniments (SALONPAS ARTHRITIS PAIN RELIEF EX) Place 1 patch onto the skin daily as needed (knee pain).      lisinopril-hydrochlorothiazide (PRINZIDE,ZESTORETIC) 20-12.5 MG tablet Take 1 tablet by mouth daily.  3   Multiple Vitamins-Minerals (OCUVITE EXTRA PO) Take 1 tablet by mouth daily after lunch.      Red Yeast Rice Extract 600 MG CAPS Take 600 mg by mouth daily after lunch.      No current facility-administered medications for this visit.    PHYSICAL EXAMINATION: ECOG PERFORMANCE STATUS: 1 - Symptomatic but completely ambulatory  Vitals:   01/21/21 1435  BP: (!) 163/70  Pulse: 84  Resp: 18  Temp: 97.8 F (36.6 C)  SpO2: 99%   Filed Weights   01/21/21 1435  Weight: 149 lb 11.2 oz (67.9 kg)    BREAST: No palpable masses or nodules in either right or left breasts. No palpable axillary supraclavicular or infraclavicular adenopathy no breast tenderness or nipple discharge. (exam performed in the presence of a chaperone)  LABORATORY DATA:  I have reviewed the data as listed CMP  Latest Ref Rng & Units 02/16/2018 12/11/2017 12/04/2017  Glucose 70 - 99 mg/dL 204(H) - 113(H)  BUN 8 - 23 mg/dL 15 - 28(H)  Creatinine 0.44 - 1.00 mg/dL 1.13(H) 1.11(H) 1.53(H)  Sodium 135 - 145 mmol/L 140 - 136  Potassium 3.5 - 5.1 mmol/L 4.1 - 4.1  Chloride 98 - 111 mmol/L 108 - 100(L)  CO2 22 - 32 mmol/L 22 - 24  Calcium 8.9 - 10.3 mg/dL 9.3 - 9.1  Total Protein 6.5 - 8.1 g/dL - - 6.8  Total Bilirubin 0.3 - 1.2 mg/dL - - 0.6  Alkaline Phos  38 - 126 U/L - - 70  AST 15 - 41 U/L - - 20  ALT 14 - 54 U/L - - 14    Lab Results  Component Value Date   WBC 11.4 (H) 12/11/2017   HGB 10.8 (L) 12/11/2017   HCT 32.6 (L) 12/11/2017   MCV 93.9 12/11/2017   PLT 186 12/11/2017   NEUTROABS 5.0 12/04/2017    ASSESSMENT & PLAN:  Malignant neoplasm of upper-outer quadrant of left breast in female, estrogen receptor positive ([place]) 12/11/2017:Left mastectomy: Invasive lobular cancer, grade 1, 2 foci largest past 4.5 cm, 0/2 lymph nodes negative, margins negative, ER 95%, PR 95%, Ki-67 2%, HER-2 negative ratio 1.34, T2N0 stage Ib   Treatment plan: No role of adjuvant radiation because Beth [name] had mastectomy. Adjuvant antiestrogen therapy with anastrozole 2.5 mg daily x5 years started 12/18/2017   Anastrozole toxicities: 1. Occ Hot flashes 2. Muscle aches 3. Mood swings 4.  Hair thinning   Breast cancer surveillance: Mammogram 12/22/2020: Benign breast density category D   Return to clinic in 1 year for follow-up      No orders of the defined types were placed in this encounter.  The patient has a good understanding of the overall plan. Beth [name] agrees with it. Beth [name] will call with any problems that may develop before the next visit here.  Total time spent: 20 mins including face to face time and time spent for planning, charting and coordination of care  Rulon Eisenmenger, MD, MPH 01/21/2021  I, Thana Ates, am acting as scribe for Dr. Nicholas Lose.  I have reviewed the above documentation for accuracy and completeness, and I agree with the above.

## 2021-01-21 ENCOUNTER — Inpatient Hospital Stay: Payer: Medicare Other | Attending: Hematology and Oncology | Admitting: Hematology and Oncology

## 2021-01-21 ENCOUNTER — Other Ambulatory Visit: Payer: Self-pay

## 2021-01-21 DIAGNOSIS — C50412 Malignant neoplasm of upper-outer quadrant of left female breast: Secondary | ICD-10-CM | POA: Diagnosis not present

## 2021-01-21 DIAGNOSIS — Z9012 Acquired absence of left breast and nipple: Secondary | ICD-10-CM | POA: Diagnosis not present

## 2021-01-21 DIAGNOSIS — M797 Fibromyalgia: Secondary | ICD-10-CM | POA: Diagnosis not present

## 2021-01-21 DIAGNOSIS — Z170 Estrogen receptor positive status [ER+]: Secondary | ICD-10-CM | POA: Diagnosis not present

## 2021-01-21 DIAGNOSIS — Z79811 Long term (current) use of aromatase inhibitors: Secondary | ICD-10-CM | POA: Insufficient documentation

## 2021-01-21 DIAGNOSIS — R232 Flushing: Secondary | ICD-10-CM | POA: Insufficient documentation

## 2021-01-21 MED ORDER — ANASTROZOLE 1 MG PO TABS: 1.0000 mg | ORAL_TABLET | Freq: Every day | ORAL | 3 refills | Status: DC

## 2021-01-21 NOTE — Assessment & Plan Note (Signed)
12/11/2017:Left mastectomy: Invasive lobular cancer, grade 1, 2 foci largest past 4.5 cm, 0/2 lymph nodes negative, margins negative, ER 95%, PR 95%, Ki-67 2%, HER-2 negative ratio 1.34, T2N0 stage Ib  Treatment plan: No role of adjuvant radiation because she had mastectomy. Adjuvant antiestrogen therapy withanastrozole2.5 mg daily x5 yearsstarted 12/18/2017  Anastrozole toxicities: 1. Occ Hot flashes 2. Muscle aches 3. Mood swings  Breast cancer surveillance: Mammogram 12/22/2020: Benign breast density category D  Return to clinic in 1 year for follow-up

## 2021-01-25 ENCOUNTER — Telehealth: Payer: Self-pay

## 2021-01-25 NOTE — Telephone Encounter (Signed)
Pt called in asking if she can start the approval process for the Gel injection. She had one 10/28/19  Please advise

## 2021-01-25 NOTE — Telephone Encounter (Signed)
Submitted for VOB for monovisc-right knee

## 2021-01-25 NOTE — Telephone Encounter (Signed)
Last injection was monovisc on 04/30/20.Marland Kitchenok to proceed

## 2021-01-26 ENCOUNTER — Telehealth: Payer: Self-pay

## 2021-01-26 NOTE — Telephone Encounter (Signed)
Noted  

## 2021-01-26 NOTE — Telephone Encounter (Signed)
Approved for Monovisc-right knee Buy and bill Dr. Ninfa Linden No copay Covered @ 100% No prior auth required

## 2021-01-26 NOTE — Telephone Encounter (Signed)
Called and scheduled

## 2021-02-01 ENCOUNTER — Encounter: Payer: Self-pay | Admitting: Physician Assistant

## 2021-02-01 ENCOUNTER — Ambulatory Visit (INDEPENDENT_AMBULATORY_CARE_PROVIDER_SITE_OTHER): Payer: Medicare Other | Admitting: Physician Assistant

## 2021-02-01 VITALS — Ht 62.0 in | Wt 148.0 lb

## 2021-02-01 DIAGNOSIS — M1711 Unilateral primary osteoarthritis, right knee: Secondary | ICD-10-CM

## 2021-02-01 MED ORDER — HYALURONAN 88 MG/4ML IX SOSY
88.0000 mg | PREFILLED_SYRINGE | INTRA_ARTICULAR | Status: AC | PRN
  Administered 2021-02-01: 88 mg via INTRA_ARTICULAR

## 2021-02-01 MED ORDER — METHYLPREDNISOLONE ACETATE 40 MG/ML IJ SUSP
40.0000 mg | INTRAMUSCULAR | Status: AC | PRN
  Administered 2021-02-01: 40 mg via INTRA_ARTICULAR

## 2021-02-01 MED ORDER — LIDOCAINE HCL 1 % IJ SOLN
3.0000 mL | INTRAMUSCULAR | Status: AC | PRN
  Administered 2021-02-01: 3 mL

## 2021-02-01 NOTE — Progress Notes (Signed)
   Procedure Note  Patient: Beth [name]             Date of Birth: [birth year]           MRN: [id number]             Visit Date: 02/01/2021 HPI: Beth [name] comes in today for right knee injection.  She states the last supplemental injection lasted about 8 months.  She is having some increased pain in the knee and swelling.  She has had no new injury.  She is here for Monovisc injection in her knee.  She has tricompartmental arthritic changes in the knee.  She has no scheduled knee surgery in the next 6 months.  She has failed conservative treatment otherwise.  Physical exam: Right knee no abnormal warmth erythema.  Slight effusion.  Good range of motion motion of the knee otherwise.  Procedures: Visit Diagnoses:  1. Unilateral primary osteoarthritis, right knee     Large Joint Inj: R knee on 02/01/2021 4:40 PM Indications: pain Details: 22 G 1.5 in needle, superolateral approach  Arthrogram: No  Medications: 88 mg Hyaluronan 88 MG/4ML; 3 mL lidocaine 1 %; 40 mg methylPREDNISolone acetate 40 MG/ML Aspirate: 5 mL yellow and blood-tinged Outcome: tolerated well, no immediate complications Procedure, treatment alternatives, risks and benefits explained, specific risks discussed. Consent was given by the patient. Immediately prior to procedure a time out was called to verify the correct patient, procedure, equipment, support staff and site/side marked as required. Patient was prepped and draped in the usual sterile fashion.    Plan: Follow-up as needed.  She notes lately 6 months between injections.  Questions encouraged and answered at length.

## 2021-03-03 DIAGNOSIS — H353132 Nonexudative age-related macular degeneration, bilateral, intermediate dry stage: Secondary | ICD-10-CM | POA: Diagnosis not present

## 2021-03-03 DIAGNOSIS — Z961 Presence of intraocular lens: Secondary | ICD-10-CM | POA: Diagnosis not present

## 2021-03-11 DIAGNOSIS — Z23 Encounter for immunization: Secondary | ICD-10-CM | POA: Diagnosis not present

## 2021-04-05 DIAGNOSIS — Z85828 Personal history of other malignant neoplasm of skin: Secondary | ICD-10-CM | POA: Diagnosis not present

## 2021-04-05 DIAGNOSIS — D2272 Melanocytic nevi of left lower limb, including hip: Secondary | ICD-10-CM | POA: Diagnosis not present

## 2021-04-05 DIAGNOSIS — L821 Other seborrheic keratosis: Secondary | ICD-10-CM | POA: Diagnosis not present

## 2021-04-05 DIAGNOSIS — L814 Other melanin hyperpigmentation: Secondary | ICD-10-CM | POA: Diagnosis not present

## 2021-04-05 DIAGNOSIS — D2271 Melanocytic nevi of right lower limb, including hip: Secondary | ICD-10-CM | POA: Diagnosis not present

## 2021-04-05 DIAGNOSIS — D225 Melanocytic nevi of trunk: Secondary | ICD-10-CM | POA: Diagnosis not present

## 2021-07-19 DIAGNOSIS — I10 Essential (primary) hypertension: Secondary | ICD-10-CM | POA: Diagnosis not present

## 2021-07-19 DIAGNOSIS — E559 Vitamin D deficiency, unspecified: Secondary | ICD-10-CM | POA: Diagnosis not present

## 2021-07-19 DIAGNOSIS — R7301 Impaired fasting glucose: Secondary | ICD-10-CM | POA: Diagnosis not present

## 2021-07-19 DIAGNOSIS — E785 Hyperlipidemia, unspecified: Secondary | ICD-10-CM | POA: Diagnosis not present

## 2021-07-26 DIAGNOSIS — I10 Essential (primary) hypertension: Secondary | ICD-10-CM | POA: Diagnosis not present

## 2021-07-26 DIAGNOSIS — Z23 Encounter for immunization: Secondary | ICD-10-CM | POA: Diagnosis not present

## 2021-07-26 DIAGNOSIS — R82998 Other abnormal findings in urine: Secondary | ICD-10-CM | POA: Diagnosis not present

## 2021-07-26 DIAGNOSIS — E663 Overweight: Secondary | ICD-10-CM | POA: Diagnosis not present

## 2021-07-26 DIAGNOSIS — I129 Hypertensive chronic kidney disease with stage 1 through stage 4 chronic kidney disease, or unspecified chronic kidney disease: Secondary | ICD-10-CM | POA: Diagnosis not present

## 2021-07-26 DIAGNOSIS — M179 Osteoarthritis of knee, unspecified: Secondary | ICD-10-CM | POA: Diagnosis not present

## 2021-07-26 DIAGNOSIS — Z1339 Encounter for screening examination for other mental health and behavioral disorders: Secondary | ICD-10-CM | POA: Diagnosis not present

## 2021-07-26 DIAGNOSIS — N1832 Chronic kidney disease, stage 3b: Secondary | ICD-10-CM | POA: Diagnosis not present

## 2021-07-26 DIAGNOSIS — E559 Vitamin D deficiency, unspecified: Secondary | ICD-10-CM | POA: Diagnosis not present

## 2021-07-26 DIAGNOSIS — E785 Hyperlipidemia, unspecified: Secondary | ICD-10-CM | POA: Diagnosis not present

## 2021-07-26 DIAGNOSIS — Z0000 Encounter for general adult medical examination without abnormal findings: Secondary | ICD-10-CM | POA: Diagnosis not present

## 2021-07-26 DIAGNOSIS — Z1331 Encounter for screening for depression: Secondary | ICD-10-CM | POA: Diagnosis not present

## 2021-08-05 ENCOUNTER — Ambulatory Visit (INDEPENDENT_AMBULATORY_CARE_PROVIDER_SITE_OTHER): Payer: Medicare Other | Admitting: Physician Assistant

## 2021-08-05 ENCOUNTER — Telehealth: Payer: Self-pay

## 2021-08-05 ENCOUNTER — Encounter: Payer: Self-pay | Admitting: Physician Assistant

## 2021-08-05 DIAGNOSIS — M1711 Unilateral primary osteoarthritis, right knee: Secondary | ICD-10-CM

## 2021-08-05 MED ORDER — LIDOCAINE HCL 1 % IJ SOLN
3.0000 mL | INTRAMUSCULAR | Status: AC | PRN
  Administered 2021-08-05: 3 mL

## 2021-08-05 MED ORDER — METHYLPREDNISOLONE ACETATE 40 MG/ML IJ SUSP
40.0000 mg | INTRAMUSCULAR | Status: AC | PRN
Start: 2021-08-05 — End: 2021-08-05
  Administered 2021-08-05: 40 mg via INTRA_ARTICULAR

## 2021-08-05 NOTE — Telephone Encounter (Signed)
Please get auth for repeat right knee gel injection

## 2021-08-05 NOTE — Progress Notes (Signed)
° °  Procedure Note  Patient: Beth [name]             Date of Birth: 1940-07-15           MRN: [id number]             Visit Date: 08/05/2021  HPI: Beth [name] comes in today due to right knee pain.  She had a gel injection with Monovisc on 02/02/2021 is done well until recently.  Has had no new injury to the knee.  Denies any fevers chills vaccines her diabetes.  She has had no change in her overall medical status.  She has known osteoarthritis of the right knee.  Review of systems: See HPI otherwise negative  Physical exam: Bilateral knees good range of motion of right knee.  Patellofemoral crepitus with passive range of motion.  No instability valgus varus stressing.  No effusion abnormal warmth or erythema. Procedures: Visit Diagnoses:  1. Unilateral primary osteoarthritis, right knee     Large Joint Inj: R knee on 08/05/2021 3:24 PM Indications: pain Details: 22 G 1.5 in needle, anterolateral approach  Arthrogram: No  Medications: 3 mL lidocaine 1 %; 40 mg methylPREDNISolone acetate 40 MG/ML Outcome: tolerated well, no immediate complications Procedure, treatment alternatives, risks and benefits explained, specific risks discussed. Consent was given by the patient. Immediately prior to procedure a time out was called to verify the correct patient, procedure, equipment, support staff and site/side marked as required. Patient was prepped and draped in the usual sterile fashion.    Plan: We will apply for supplemental injection and have her back once this is available.  Questions were encouraged and answered length today.  She tolerated the injection well.

## 2021-08-06 NOTE — Telephone Encounter (Signed)
Noted  

## 2021-09-14 ENCOUNTER — Telehealth: Payer: Self-pay

## 2021-09-14 NOTE — Telephone Encounter (Signed)
BV pending for Monovisc, right knee.  

## 2021-09-21 ENCOUNTER — Telehealth: Payer: Self-pay

## 2021-09-21 NOTE — Telephone Encounter (Signed)
Approved for Monovisc, right knee. ?[place] ?Must meet medicare deductible ?Secondary insurance [employer] Complete will pick up remaining eligible expenses at 100%. ?No Co-pay ?No PA required ? ?Appt. 10/04/2021 with Dr. Ninfa Linden ?

## 2021-10-04 ENCOUNTER — Ambulatory Visit (INDEPENDENT_AMBULATORY_CARE_PROVIDER_SITE_OTHER): Payer: Medicare Other | Admitting: Orthopaedic Surgery

## 2021-10-04 ENCOUNTER — Encounter: Payer: Self-pay | Admitting: Orthopaedic Surgery

## 2021-10-04 DIAGNOSIS — M1711 Unilateral primary osteoarthritis, right knee: Secondary | ICD-10-CM

## 2021-10-04 DIAGNOSIS — M25561 Pain in right knee: Secondary | ICD-10-CM | POA: Diagnosis not present

## 2021-10-04 DIAGNOSIS — G8929 Other chronic pain: Secondary | ICD-10-CM

## 2021-10-04 MED ORDER — HYALURONAN 88 MG/4ML IX SOSY
88.0000 mg | PREFILLED_SYRINGE | INTRA_ARTICULAR | Status: AC | PRN
  Administered 2021-10-04: 88 mg via INTRA_ARTICULAR

## 2021-10-04 NOTE — Progress Notes (Signed)
? ?  Procedure Note ? ?Patient: Beth [name]             ?Date of Birth: Aug 27, 1940           ?MRN: [id number]             ?Visit Date: 10/04/2021 ? ?Procedures: ?Visit Diagnoses:  ?1. Unilateral primary osteoarthritis, right knee   ?2. Chronic pain of right knee   ? ? ?Large Joint Inj: R knee on 10/04/2021 1:01 PM ?Indications: diagnostic evaluation and pain ?Details: 22 G 1.5 in needle, superolateral approach ? ?Arthrogram: No ? ?Medications: 88 mg Hyaluronan 88 MG/4ML ?Outcome: tolerated well, no immediate complications ?Procedure, treatment alternatives, risks and benefits explained, specific risks discussed. Consent was given by the patient. Immediately prior to procedure a time out was called to verify the correct patient, procedure, equipment, support staff and site/side marked as required. Patient was prepped and draped in the usual sterile fashion.  ? ?The patient is scheduled today for a hyaluronic acid injection with Monovisc in her right knee to treat the pain from osteoarthritis.  She understands fully why we are recommending this injection.  She has tried failed other conservative treatment measures including steroid injection to treat the pain from osteoarthritis of her right knee.  She has had no acute change in medical status.  There is no listed fever or chills.  Of note she has tolerated injections this in the past. ? ?I did place Monovisc in her right knee today without difficulty.  All questions and concerns were answered and addressed.  Follow-up is as needed. ? ? ? ?

## 2021-12-20 ENCOUNTER — Other Ambulatory Visit: Payer: Self-pay | Admitting: Internal Medicine

## 2021-12-20 DIAGNOSIS — Z1231 Encounter for screening mammogram for malignant neoplasm of breast: Secondary | ICD-10-CM

## 2022-01-04 ENCOUNTER — Ambulatory Visit
Admission: RE | Admit: 2022-01-04 | Discharge: 2022-01-04 | Disposition: A | Discharge status: Home / Self Care | Payer: Medicare Other | Source: Ambulatory Visit | Attending: Internal Medicine | Admitting: Internal Medicine

## 2022-01-04 DIAGNOSIS — Z1231 Encounter for screening mammogram for malignant neoplasm of breast: Secondary | ICD-10-CM

## 2022-01-14 NOTE — Progress Notes (Signed)
-  Patient Care Team: Burnard Bunting, MD as PCP - General (Internal Medicine) [name], MD as Consulting Physician (General Surgery) Nicholas Lose, MD as Consulting Physician (Hematology and Oncology) Eppie Gibson, MD as Attending Physician (Radiation Oncology) Gardenia Phlegm, NP as Nurse Practitioner (Hematology and Oncology)  DIAGNOSIS:  Encounter Diagnosis  Name Primary?   Malignant neoplasm of upper-outer quadrant of left breast in female, estrogen receptor positive ([place])     SUMMARY OF ONCOLOGIC HISTORY: Oncology History  Malignant neoplasm of upper-outer quadrant of left breast in female, estrogen receptor positive ([place])  10/09/2017 Initial Diagnosis   Screening detected left breast mass at 3 o'clock position 2.8 x 2.8 x 2.2 cm, lymph nodes negative, biopsy revealed grade 2 invasive lobular cancer ER 95%, PR 95%, Ki-67 2%, HER-2 negative ratio 1.34, T2N0 stage Ib clinical stage AJCC 8   12/11/2017 Surgery   Left mastectomy: Invasive lobular cancer, grade 1, 2 foci largest past 4.5 cm, 0/2 lymph nodes negative, margins negative, ER 95%, PR 95%, Ki-67 2%, HER-2 negative ratio 1.34, T2N0 stage Ib   12/27/2017 Cancer Staging   Staging form: Breast, AJCC 8th Edition - Pathologic: Stage IA (pT2, pN0, cM0, G1, ER+, PR+, HER2-) - Signed by Gardenia Phlegm, NP on 12/27/2017   12/2017 -  Anti-estrogen oral therapy   Anastrozole     CHIEF COMPLIANT: Follow-up of left breast cancer on anastrozole    INTERVAL HISTORY: Beth [name] is a  81 y.o. with above-mentioned history of left breast cancer treated with mastectomy and who is currently on anastrozole. She presents to the clinic today for a follow-up.  She is tolerating anastrozole extremely well without any problems or concerns.  She denies any lumps or nodules in the breast.   ALLERGIES:  is allergic to aleve [naproxen sodium].  MEDICATIONS:  Current Outpatient Medications  Medication Sig Dispense  Refill   anastrozole (ARIMIDEX) 1 MG tablet Take 1 tablet (1 mg total) by mouth daily. 90 tablet 3   carboxymethylcellulose (REFRESH PLUS) 0.5 % SOLN Place 1 drop into both eyes 3 (three) times daily as needed (dry eyes).     cholecalciferol (VITAMIN D) 1000 units tablet Take 1,000 Units by mouth daily with lunch.      glucosamine-chondroitin 500-400 MG tablet Take 1 tablet by mouth daily. (Patient not taking: No sig reported)     ibuprofen (ADVIL,MOTRIN) 200 MG tablet Take 600 mg by mouth daily as needed (for pain).     Liniments (SALONPAS ARTHRITIS PAIN RELIEF EX) Place 1 patch onto the skin daily as needed (knee pain).      lisinopril-hydrochlorothiazide (PRINZIDE,ZESTORETIC) 20-12.5 MG tablet Take 1 tablet by mouth daily.  3   Multiple Vitamins-Minerals (OCUVITE EXTRA PO) Take 1 tablet by mouth daily after lunch.      Red Yeast Rice Extract 600 MG CAPS Take 600 mg by mouth daily after lunch.      No current facility-administered medications for this visit.    PHYSICAL EXAMINATION: ECOG PERFORMANCE STATUS: 1 - Symptomatic but completely ambulatory  Vitals:   01/24/22 1445  BP: (!) 178/63  Pulse: 71  Resp: 15  Temp: (!) 97.3 F (36.3 C)  SpO2: 100%   Filed Weights   01/24/22 1445  Weight: 146 lb 6.4 oz (66.4 kg)    BREAST: No palpable masses or nodules in either right or left breasts. No palpable axillary supraclavicular or infraclavicular adenopathy no breast tenderness or nipple discharge. (exam performed in the presence of a chaperone)  LABORATORY DATA:  I have reviewed the data as listed    Latest Ref Rng & Units 02/16/2018   12:13 PM 12/11/2017    1:28 PM 12/04/2017    3:17 PM  CMP  Glucose 70 - 99 mg/dL 204   113   BUN 8 - 23 mg/dL 15   28   Creatinine 0.44 - 1.00 mg/dL 1.13  1.11  1.53   Sodium 135 - 145 mmol/L 140   136   Potassium 3.5 - 5.1 mmol/L 4.1   4.1   Chloride 98 - 111 mmol/L 108   100   CO2 22 - 32 mmol/L 22   24   Calcium 8.9 - 10.3 mg/dL 9.3   9.1    Total Protein 6.5 - 8.1 g/dL   6.8   Total Bilirubin 0.3 - 1.2 mg/dL   0.6   Alkaline Phos 38 - 126 U/L   70   AST 15 - 41 U/L   20   ALT 14 - 54 U/L   14     Lab Results  Component Value Date   WBC 11.4 (H) 12/11/2017   HGB 10.8 (L) 12/11/2017   HCT 32.6 (L) 12/11/2017   MCV 93.9 12/11/2017   PLT 186 12/11/2017   NEUTROABS 5.0 12/04/2017    ASSESSMENT & PLAN:  Malignant neoplasm of upper-outer quadrant of left breast in female, estrogen receptor positive ([name]) 12/11/2017:Left mastectomy: Invasive lobular cancer, grade 1, 2 foci largest past 4.5 cm, 0/2 lymph nodes negative, margins negative, ER 95%, PR 95%, Ki-67 2%, HER-2 negative ratio 1.34, T2N0 stage Ib   Treatment plan: No role of adjuvant radiation because she had mastectomy. Adjuvant antiestrogen therapy with anastrozole 2.5 mg daily x5 years started 12/18/2017   Anastrozole toxicities: 1. Occ Hot flashes 2. Muscle aches 3. Mood swings 4.  Hair thinning   Breast cancer surveillance: 1. Mammogram  01/05/2022: Benign breast density category C 2. breast exam 01/24/2022: Benign   Return to clinic in 1 year for follow-up and after that she could be followed on an as-needed basis.   No orders of the defined types were placed in this encounter.  The patient has a good understanding of the overall plan. she agrees with it. she will call with any problems that may develop before the next visit here. Total time spent: 30 mins including face to face time and time spent for planning, charting and co-ordination of care   [name], MD 01/24/22    I Gardiner Coins am scribing for Dr. Lindi Adie  I have reviewed the above documentation for accuracy and completeness, and I agree with the above.

## 2022-01-24 ENCOUNTER — Inpatient Hospital Stay: Payer: Medicare Other | Attending: Hematology and Oncology | Admitting: Hematology and Oncology

## 2022-01-24 ENCOUNTER — Other Ambulatory Visit: Payer: Self-pay

## 2022-01-24 DIAGNOSIS — Z79899 Other long term (current) drug therapy: Secondary | ICD-10-CM | POA: Diagnosis not present

## 2022-01-24 DIAGNOSIS — Z170 Estrogen receptor positive status [ER+]: Secondary | ICD-10-CM

## 2022-01-24 DIAGNOSIS — C50412 Malignant neoplasm of upper-outer quadrant of left female breast: Secondary | ICD-10-CM | POA: Diagnosis not present

## 2022-01-24 DIAGNOSIS — Z79811 Long term (current) use of aromatase inhibitors: Secondary | ICD-10-CM | POA: Diagnosis not present

## 2022-01-24 DIAGNOSIS — C50812 Malignant neoplasm of overlapping sites of left female breast: Secondary | ICD-10-CM | POA: Diagnosis not present

## 2022-01-24 MED ORDER — ANASTROZOLE 1 MG PO TABS: 1.0000 mg | ORAL_TABLET | Freq: Every day | ORAL | 3 refills | Status: DC

## 2022-01-24 NOTE — Assessment & Plan Note (Signed)
12/11/2017:Left mastectomy: Invasive lobular cancer, grade 1, 2 foci largest past 4.5 cm, 0/2 lymph nodes negative, margins negative, ER 95%, PR 95%, Ki-67 2%, HER-2 negative ratio 1.34, T2N0 stage Ib  Treatment plan: No role of adjuvant radiation because she had mastectomy. Adjuvant antiestrogen therapy withanastrozole2.5 mg daily x5 yearsstarted 12/18/2017  Anastrozole toxicities: 1. Occ Hot flashes 2. Muscle aches 3. Mood swings 4.  Hair thinning  Breast cancer surveillance: 1. Mammogram  01/05/2022: Benign breast density category C 2. breast exam 01/24/2022: Benign  Return to clinic in 1 year for follow-up

## 2022-02-21 DIAGNOSIS — Z23 Encounter for immunization: Secondary | ICD-10-CM | POA: Diagnosis not present

## 2022-03-30 DIAGNOSIS — N390 Urinary tract infection, site not specified: Secondary | ICD-10-CM | POA: Diagnosis not present

## 2022-03-30 DIAGNOSIS — R102 Pelvic and perineal pain: Secondary | ICD-10-CM | POA: Diagnosis not present

## 2022-03-30 DIAGNOSIS — I10 Essential (primary) hypertension: Secondary | ICD-10-CM | POA: Diagnosis not present

## 2022-03-30 DIAGNOSIS — I872 Venous insufficiency (chronic) (peripheral): Secondary | ICD-10-CM | POA: Diagnosis not present

## 2022-03-30 DIAGNOSIS — R109 Unspecified abdominal pain: Secondary | ICD-10-CM | POA: Diagnosis not present

## 2022-03-31 DIAGNOSIS — U071 COVID-19: Secondary | ICD-10-CM | POA: Diagnosis not present

## 2022-03-31 DIAGNOSIS — N390 Urinary tract infection, site not specified: Secondary | ICD-10-CM | POA: Diagnosis not present

## 2022-03-31 DIAGNOSIS — R300 Dysuria: Secondary | ICD-10-CM | POA: Diagnosis not present

## 2022-04-11 DIAGNOSIS — L570 Actinic keratosis: Secondary | ICD-10-CM | POA: Diagnosis not present

## 2022-04-11 DIAGNOSIS — L821 Other seborrheic keratosis: Secondary | ICD-10-CM | POA: Diagnosis not present

## 2022-04-11 DIAGNOSIS — D225 Melanocytic nevi of trunk: Secondary | ICD-10-CM | POA: Diagnosis not present

## 2022-04-11 DIAGNOSIS — Z85828 Personal history of other malignant neoplasm of skin: Secondary | ICD-10-CM | POA: Diagnosis not present

## 2022-04-11 DIAGNOSIS — L814 Other melanin hyperpigmentation: Secondary | ICD-10-CM | POA: Diagnosis not present

## 2022-06-20 ENCOUNTER — Ambulatory Visit (INDEPENDENT_AMBULATORY_CARE_PROVIDER_SITE_OTHER): Payer: Medicare Other | Admitting: Orthopaedic Surgery

## 2022-06-20 ENCOUNTER — Encounter: Payer: Self-pay | Admitting: Orthopaedic Surgery

## 2022-06-20 DIAGNOSIS — M1711 Unilateral primary osteoarthritis, right knee: Secondary | ICD-10-CM | POA: Diagnosis not present

## 2022-06-20 MED ORDER — HYALURONAN 88 MG/4ML IX SOSY
88.0000 mg | PREFILLED_SYRINGE | INTRA_ARTICULAR | Status: AC | PRN
  Administered 2022-06-20: 88 mg via INTRA_ARTICULAR

## 2022-06-20 NOTE — Progress Notes (Signed)
   Procedure Note  Patient: Beth [name]             Date of Birth: 1941/03/10           MRN: [id number]             Visit Date: 06/20/2022  Procedures: Visit Diagnoses:  1. Unilateral primary osteoarthritis, right knee     Large Joint Inj: R knee on 06/20/2022 1:02 PM Indications: diagnostic evaluation and pain Details: 22 G 1.5 in needle, superolateral approach  Arthrogram: No  Medications: 88 mg Hyaluronan 88 MG/4ML Outcome: tolerated well, no immediate complications Procedure, treatment alternatives, risks and benefits explained, specific risks discussed. Consent was given by the patient. Immediately prior to procedure a time out was called to verify the correct patient, procedure, equipment, support staff and site/side marked as required. Patient was prepped and draped in the usual sterile fashion.    The patient comes in today for scheduled hyaluronic acid injection of Monovisc in her right knee to treat the pain from osteoarthritis.  She has had hyaluronic acid before that is helped.  She has had no acute change in medical status.  She went over 6 months between injections.  On exam her right knee shows no effusion but there is some slight varus malalignment and global tenderness and pain.  Overall she has good function of the knee.  I did place Monovisc in her right knee without difficulty.  As always follow-up is as needed.  If things worsen she knows to let us know.   Lot #[id number] Expiration 01/10/2025

## 2022-09-26 DIAGNOSIS — R7301 Impaired fasting glucose: Secondary | ICD-10-CM | POA: Diagnosis not present

## 2022-09-26 DIAGNOSIS — I129 Hypertensive chronic kidney disease with stage 1 through stage 4 chronic kidney disease, or unspecified chronic kidney disease: Secondary | ICD-10-CM | POA: Diagnosis not present

## 2022-09-26 DIAGNOSIS — I872 Venous insufficiency (chronic) (peripheral): Secondary | ICD-10-CM | POA: Diagnosis not present

## 2022-09-26 DIAGNOSIS — N1832 Chronic kidney disease, stage 3b: Secondary | ICD-10-CM | POA: Diagnosis not present

## 2022-10-12 ENCOUNTER — Telehealth: Payer: Self-pay | Admitting: Orthopaedic Surgery

## 2022-10-12 NOTE — Telephone Encounter (Signed)
Patient wants to get a gel injection in right knee

## 2022-10-14 NOTE — Telephone Encounter (Signed)
VOB submitted for Monovisc, right knee  

## 2022-11-02 ENCOUNTER — Other Ambulatory Visit: Payer: Self-pay

## 2022-11-02 DIAGNOSIS — M1711 Unilateral primary osteoarthritis, right knee: Secondary | ICD-10-CM

## 2022-11-21 ENCOUNTER — Ambulatory Visit: Payer: Medicare Other | Admitting: Physician Assistant

## 2022-11-22 ENCOUNTER — Ambulatory Visit (INDEPENDENT_AMBULATORY_CARE_PROVIDER_SITE_OTHER): Payer: Medicare Other | Admitting: Physician Assistant

## 2022-11-22 DIAGNOSIS — M25561 Pain in right knee: Secondary | ICD-10-CM

## 2022-11-22 DIAGNOSIS — M1711 Unilateral primary osteoarthritis, right knee: Secondary | ICD-10-CM

## 2022-11-22 MED ORDER — HYALURONAN 30 MG/2ML IX SOSY
30.0000 mg | PREFILLED_SYRINGE | INTRA_ARTICULAR | Status: AC | PRN
  Administered 2022-11-22: 30 mg via INTRA_ARTICULAR

## 2022-11-22 MED ORDER — BUPIVACAINE HCL 0.25 % IJ SOLN
2.0000 mL | INTRAMUSCULAR | Status: AC | PRN
  Administered 2022-11-22: 2 mL via INTRA_ARTICULAR

## 2022-11-22 MED ORDER — LIDOCAINE HCL 1 % IJ SOLN
2.0000 mL | INTRAMUSCULAR | Status: AC | PRN
  Administered 2022-11-22: 2 mL

## 2022-11-22 NOTE — Progress Notes (Signed)
Office Visit Note   Patient: Beth [name]           Date of Birth: 04/28/1941           MRN: [id number] Visit Date: 11/22/2022              Requested by: Geoffry Paradise, MD [address] [place],  [place] [address] PCP: Geoffry Paradise, MD   Assessment & Plan: Visit Diagnoses:  1. Unilateral primary osteoarthritis, right knee     Plan: Impression is right knee osteoarthritis.  Today, we proceeded with right knee Monovisc injection.  She tolerated this well.  She will follow-up as needed.  Monovisc injection Expiration date 07/12/2025 Lot #[id number]  Follow-Up Instructions: Return if symptoms worsen or fail to improve.   Orders:  Orders Placed This Encounter  Procedures   Large Joint Inj: R knee   No orders of the defined types were placed in this encounter.     Procedures: Large Joint Inj: R knee on 11/22/2022 1:37 PM Indications: pain Details: 22 G needle, anterolateral approach Medications: 2 mL lidocaine 1 %; 2 mL bupivacaine 0.25 %; 30 mg Hyaluronan 30 MG/2ML      Clinical Data: No additional findings.   Subjective: Chief Complaint  Patient presents with   Right Knee - Follow-up    HPI patient is a pleasant 82 year old female with underlying right knee osteoarthritis who comes in today for Orthovisc injection.  She typically gets these every 6 months which provide great relief.     Objective: Vital Signs: There were no vitals taken for this visit.    Ortho Exam right knee exam: Small effusion.  Range of motion 0 to 110 degrees.  Minimal medial joint line tenderness.  She is neurovascularly intact distally.  Specialty Comments:  No specialty comments available.  Imaging: No new imaging   PMFS History: Patient Active Problem List   Diagnosis Date Noted   Primary osteoarthritis of left knee 10/21/2019   Breast cancer of upper-outer quadrant of left female breast (HCC) 12/11/2017   Malignant neoplasm of upper-outer quadrant of left  breast in female, estrogen receptor positive ([facility]) 10/13/2017   Unilateral primary osteoarthritis, right knee 07/17/2017   Chronic pain of right knee 07/17/2017   Cholelithiasis with chronic cholecystitis 03/20/2016   Past Medical History:  Diagnosis Date   Arthritis    "right knee" (12/11/2017)   Breast cancer (HCC) 2019   Left Breast Cancer   Breast cancer, left breast (HCC) 09/2017   Hypertension     Family History  Problem Relation Age of Onset   Colon cancer Maternal Aunt    Breast cancer Maternal Aunt    Colon cancer Maternal Aunt     Past Surgical History:  Procedure Laterality Date   BREAST BIOPSY Bilateral 1960s   EXCISIONAL BIOPSY   BUNIONECTOMY Bilateral 1970s   CHOLECYSTECTOMY N/A 03/22/2016   Procedure: LAPAROSCOPIC CHOLECYSTECTOMY WITH INTRAOPERATIVE CHOLANGIOGRAM;  Surgeon: Darnell Level, MD;  Location: [facility];  Service: General;  Laterality: N/A;   CYST EXCISION Right    palm/thumb-side   EVACUATION BREAST HEMATOMA Left 02/19/2018   Procedure: DRAINAGE LEFT MASTECTOMY SITE;  Surgeon: Claud Kelp, MD;  Location: [facility];  Service: General;  Laterality: Left;   MASTECTOMY COMPLETE / SIMPLE W/ SENTINEL NODE BIOPSY Left 12/11/2017   AXILLARY DEEP SENTINEL LYMPH NODE BIOPSY   MASTECTOMY W/ SENTINEL NODE BIOPSY Left 12/11/2017   Procedure: INJECT BLUE DYE LEFT BREAST, LEFT TOTAL MASTECTOMY WITH WITH LEFT AXILLARY  DEEP SENTINEL LYMPH NODE BIOPSY;  Surgeon: Claud Kelp, MD;  Location: [facility];  Service: General;  Laterality: Left;   TONSILLECTOMY     Social History   Occupational History   Not on file  Tobacco Use   Smoking status: Never   Smokeless tobacco: Never  Vaping Use   Vaping Use: Never used  Substance and Sexual Activity   Alcohol use: Yes    Alcohol/week: 2.0 standard drinks of alcohol    Types: 2 Glasses of wine per week   Drug use: Never   Sexual activity: Not Currently

## 2022-11-25 ENCOUNTER — Other Ambulatory Visit: Payer: Self-pay | Admitting: Internal Medicine

## 2022-11-25 DIAGNOSIS — Z0000 Encounter for general adult medical examination without abnormal findings: Secondary | ICD-10-CM

## 2022-12-30 ENCOUNTER — Other Ambulatory Visit: Payer: Self-pay | Admitting: Hematology and Oncology

## 2023-01-09 ENCOUNTER — Ambulatory Visit
Admission: RE | Admit: 2023-01-09 | Discharge: 2023-01-09 | Disposition: A | Discharge status: Home / Self Care | Payer: Medicare Other | Source: Ambulatory Visit | Attending: Internal Medicine | Admitting: Internal Medicine

## 2023-01-09 ENCOUNTER — Telehealth: Payer: Self-pay | Admitting: Hematology and Oncology

## 2023-01-09 DIAGNOSIS — Z0000 Encounter for general adult medical examination without abnormal findings: Secondary | ICD-10-CM

## 2023-01-09 DIAGNOSIS — Z1231 Encounter for screening mammogram for malignant neoplasm of breast: Secondary | ICD-10-CM | POA: Diagnosis not present

## 2023-01-09 NOTE — Telephone Encounter (Signed)
Left a message on both patient's contacts about rescheduled appointment times/dates

## 2023-01-16 DIAGNOSIS — H353131 Nonexudative age-related macular degeneration, bilateral, early dry stage: Secondary | ICD-10-CM | POA: Diagnosis not present

## 2023-01-16 DIAGNOSIS — Z961 Presence of intraocular lens: Secondary | ICD-10-CM | POA: Diagnosis not present

## 2023-01-25 ENCOUNTER — Ambulatory Visit: Payer: Medicare Other | Admitting: Hematology and Oncology

## 2023-02-16 ENCOUNTER — Inpatient Hospital Stay: Payer: Medicare Other | Attending: Hematology and Oncology | Admitting: Hematology and Oncology

## 2023-02-16 ENCOUNTER — Other Ambulatory Visit: Payer: Self-pay | Admitting: Hematology and Oncology

## 2023-02-16 ENCOUNTER — Ambulatory Visit (HOSPITAL_COMMUNITY)
Admission: RE | Admit: 2023-02-16 | Discharge: 2023-02-16 | Disposition: A | Discharge status: Home / Self Care | Payer: Medicare Other | Source: Ambulatory Visit | Attending: Hematology and Oncology | Admitting: Hematology and Oncology

## 2023-02-16 VITALS — BP 181/76 | HR 73 | Temp 97.2°F | Resp 18 | Ht 62.0 in | Wt 140.8 lb

## 2023-02-16 DIAGNOSIS — S7001XA Contusion of right hip, initial encounter: Secondary | ICD-10-CM | POA: Diagnosis not present

## 2023-02-16 DIAGNOSIS — Z79811 Long term (current) use of aromatase inhibitors: Secondary | ICD-10-CM | POA: Diagnosis not present

## 2023-02-16 DIAGNOSIS — C50412 Malignant neoplasm of upper-outer quadrant of left female breast: Secondary | ICD-10-CM | POA: Insufficient documentation

## 2023-02-16 DIAGNOSIS — Z170 Estrogen receptor positive status [ER+]: Secondary | ICD-10-CM | POA: Insufficient documentation

## 2023-02-16 DIAGNOSIS — Z9012 Acquired absence of left breast and nipple: Secondary | ICD-10-CM | POA: Insufficient documentation

## 2023-02-16 DIAGNOSIS — Z043 Encounter for examination and observation following other accident: Secondary | ICD-10-CM | POA: Diagnosis not present

## 2023-02-16 DIAGNOSIS — M25551 Pain in right hip: Secondary | ICD-10-CM | POA: Diagnosis not present

## 2023-02-16 NOTE — Assessment & Plan Note (Addendum)
12/11/2017:Left mastectomy: Invasive lobular cancer, grade 1, 2 foci largest past 4.5 cm, 0/2 lymph nodes negative, margins negative, ER 95%, PR 95%, Ki-67 2%, HER-2 negative ratio 1.34, T2N0 stage Ib   Treatment plan: No role of adjuvant radiation because she had mastectomy. Adjuvant antiestrogen therapy with anastrozole 2.5 mg daily x5 years started 12/18/2017 completed 02/16/2023   Anastrozole toxicities: 1. Occ Hot flashes 2. Muscle aches 3. Mood swings 4.  Hair thinning  Fall this morning: She is hurting in the ribs as well as the right hip area.  We will obtain x-rays of the ribs and the hip and I will call her with the result of this.   Breast cancer surveillance: 1. Mammogram 01/11/2023: Benign breast density category C 2. breast exam 02/16/2023: Benign   Return to clinic on an as-needed basis.

## 2023-02-16 NOTE — Progress Notes (Signed)
Patient Care Team: Geoffry Paradise, MD as PCP - General (Internal Medicine) Claud Kelp, MD as Consulting Physician (General Surgery) Serena Croissant, MD as Consulting Physician (Hematology and Oncology) Lonie Peak, MD as Attending Physician (Radiation Oncology) Loa Socks, NP as Nurse Practitioner (Hematology and Oncology)  DIAGNOSIS:  Encounter Diagnosis  Name Primary?   Malignant neoplasm of upper-outer quadrant of left breast in female, estrogen receptor positive ([facility]) Yes    SUMMARY OF ONCOLOGIC HISTORY: Oncology History  Malignant neoplasm of upper-outer quadrant of left breast in female, estrogen receptor positive ([facility])  10/09/2017 Initial Diagnosis   Screening detected left breast mass at 3 o'clock position 2.8 x 2.8 x 2.2 cm, lymph nodes negative, biopsy revealed grade 2 invasive lobular cancer ER 95%, PR 95%, Ki-67 2%, HER-2 negative ratio 1.34, T2N0 stage Ib clinical stage AJCC 8   12/11/2017 Surgery   Left mastectomy: Invasive lobular cancer, grade 1, 2 foci largest past 4.5 cm, 0/2 lymph nodes negative, margins negative, ER 95%, PR 95%, Ki-67 2%, HER-2 negative ratio 1.34, T2N0 stage Ib   12/27/2017 Cancer Staging   Staging form: Breast, AJCC 8th Edition - Pathologic: Stage IA (pT2, pN0, cM0, G1, ER+, PR+, HER2-) - Signed by Loa Socks, NP on 12/27/2017   12/2017 -  Anti-estrogen oral therapy   Anastrozole     CHIEF COMPLIANT: Follow-up of left breast cancer on anastrozole   INTERVAL HISTORY: Beth [name] is a 82 y.o. with above-mentioned history of left breast cancer treated with mastectomy and who is currently on anastrozole. She presents to the clinic today for a follow-up. Patient reports that she fell and is complaining of pain in the ribs on the right side as well as the right hip area.   ALLERGIES:  is allergic to aleve [naproxen sodium].  MEDICATIONS:  Current Outpatient Medications  Medication Sig Dispense Refill    anastrozole (ARIMIDEX) 1 MG tablet TAKE ONE TABLET BY MOUTH ONE TIME DAILY 90 tablet 0   carboxymethylcellulose (REFRESH PLUS) 0.5 % SOLN Place 1 drop into both eyes 3 (three) times daily as needed (dry eyes).     cholecalciferol (VITAMIN D) 1000 units tablet Take 1,000 Units by mouth daily with lunch.      glucosamine-chondroitin 500-400 MG tablet Take 1 tablet by mouth daily. (Patient not taking: No sig reported)     ibuprofen (ADVIL,MOTRIN) 200 MG tablet Take 600 mg by mouth daily as needed (for pain).     Liniments (SALONPAS ARTHRITIS PAIN RELIEF EX) Place 1 patch onto the skin daily as needed (knee pain).      lisinopril-hydrochlorothiazide (PRINZIDE,ZESTORETIC) 20-12.5 MG tablet Take 1 tablet by mouth daily.  3   Multiple Vitamins-Minerals (OCUVITE EXTRA PO) Take 1 tablet by mouth daily after lunch.      No current facility-administered medications for this visit.    PHYSICAL EXAMINATION: ECOG PERFORMANCE STATUS: 1 - Symptomatic but completely ambulatory  Vitals:   02/16/23 1338  BP: (!) 181/76  Pulse: 73  Resp: 18  Temp: (!) 97.2 F (36.2 C)  SpO2: 99%   Filed Weights   02/16/23 1338  Weight: 140 lb 12.8 oz (63.9 kg)    LABORATORY DATA:  I have reviewed the data as listed    Latest Ref Rng & Units 02/16/2018   12:13 PM 12/11/2017    1:28 PM 12/04/2017    3:17 PM  CMP  Glucose 70 - 99 mg/dL 621   308   BUN 8 - 23 mg/dL 15  28   Creatinine 0.44 - 1.00 mg/dL 4.09  8.11  9.14   Sodium 135 - 145 mmol/L 140   136   Potassium 3.5 - 5.1 mmol/L 4.1   4.1   Chloride 98 - 111 mmol/L 108   100   CO2 22 - 32 mmol/L 22   24   Calcium 8.9 - 10.3 mg/dL 9.3   9.1   Total Protein 6.5 - 8.1 g/dL   6.8   Total Bilirubin 0.3 - 1.2 mg/dL   0.6   Alkaline Phos 38 - 126 U/L   70   AST 15 - 41 U/L   20   ALT 14 - 54 U/L   14     Lab Results  Component Value Date   WBC 11.4 (H) 12/11/2017   HGB 10.8 (L) 12/11/2017   HCT 32.6 (L) 12/11/2017   MCV 93.9 12/11/2017   PLT 186 12/11/2017    NEUTROABS 5.0 12/04/2017    ASSESSMENT & PLAN:  Malignant neoplasm of upper-outer quadrant of left breast in female, estrogen receptor positive ([facility]) 12/11/2017:Left mastectomy: Invasive lobular cancer, grade 1, 2 foci largest past 4.5 cm, 0/2 lymph nodes negative, margins negative, ER 95%, PR 95%, Ki-67 2%, HER-2 negative ratio 1.34, T2N0 stage Ib   Treatment plan: No role of adjuvant radiation because she had mastectomy. Adjuvant antiestrogen therapy with anastrozole 2.5 mg daily x5 years started 12/18/2017 completed 02/16/2023   Anastrozole toxicities: 1. Occ Hot flashes 2. Muscle aches 3. Mood swings 4.  Hair thinning  Fall this morning: She is hurting in the ribs as well as the right hip area.  We will obtain x-rays of the ribs and the hip and I will call her with the result of this.   Breast cancer surveillance: 1. Mammogram 01/11/2023: Benign breast density category C 2. breast exam 02/16/2023: Benign   Return to clinic on an as-needed basis.       Orders Placed This Encounter  Procedures   DG Chest 2 View    Standing Status:   Future    Number of Occurrences:   1    Standing Expiration Date:   02/16/2024    Order Specific Question:   Reason for Exam (SYMPTOM  OR DIAGNOSIS REQUIRED)    Answer:   Pt fell on right side evaluate chest    Order Specific Question:   Preferred imaging location?    Answer:   [facility]    Order Specific Question:   Release to patient    Answer:   Immediate   DG Hip Unilat W or W/O Pelvis 1 View Right    Standing Status:   Future    Number of Occurrences:   1    Standing Expiration Date:   02/16/2024    Order Specific Question:   Reason for Exam (SYMPTOM  OR DIAGNOSIS REQUIRED)    Answer:   pt fell on right side. Evaluate right ribs and chest    Order Specific Question:   Preferred imaging location?    Answer:   [facility]    Order Specific Question:   Release to patient    Answer:   Immediate   The patient has a good  understanding of the overall plan. she agrees with it. she will call with any problems that may develop before the next visit here. Total time spent: 30 mins including face to face time and time spent for planning, charting and co-ordination of care  Tamsen Meek, MD 02/16/23    I Janan Ridge am acting as a Neurosurgeon for [employer]  I have reviewed the above documentation for accuracy and completeness, and I agree with the above.

## 2023-03-07 DIAGNOSIS — L821 Other seborrheic keratosis: Secondary | ICD-10-CM | POA: Diagnosis not present

## 2023-03-07 DIAGNOSIS — L218 Other seborrheic dermatitis: Secondary | ICD-10-CM | POA: Diagnosis not present

## 2023-03-07 DIAGNOSIS — Z85828 Personal history of other malignant neoplasm of skin: Secondary | ICD-10-CM | POA: Diagnosis not present

## 2023-03-07 DIAGNOSIS — D3617 Benign neoplasm of peripheral nerves and autonomic nervous system of trunk, unspecified: Secondary | ICD-10-CM | POA: Diagnosis not present

## 2023-03-07 DIAGNOSIS — L814 Other melanin hyperpigmentation: Secondary | ICD-10-CM | POA: Diagnosis not present

## 2023-03-07 DIAGNOSIS — L570 Actinic keratosis: Secondary | ICD-10-CM | POA: Diagnosis not present

## 2023-03-16 DIAGNOSIS — Z23 Encounter for immunization: Secondary | ICD-10-CM | POA: Diagnosis not present

## 2023-03-28 DIAGNOSIS — I10 Essential (primary) hypertension: Secondary | ICD-10-CM | POA: Diagnosis not present

## 2023-03-28 DIAGNOSIS — E559 Vitamin D deficiency, unspecified: Secondary | ICD-10-CM | POA: Diagnosis not present

## 2023-03-28 DIAGNOSIS — Z1212 Encounter for screening for malignant neoplasm of rectum: Secondary | ICD-10-CM | POA: Diagnosis not present

## 2023-03-28 DIAGNOSIS — Z1389 Encounter for screening for other disorder: Secondary | ICD-10-CM | POA: Diagnosis not present

## 2023-03-28 DIAGNOSIS — R7301 Impaired fasting glucose: Secondary | ICD-10-CM | POA: Diagnosis not present

## 2023-04-03 DIAGNOSIS — R82998 Other abnormal findings in urine: Secondary | ICD-10-CM | POA: Diagnosis not present

## 2023-04-03 DIAGNOSIS — Z1339 Encounter for screening examination for other mental health and behavioral disorders: Secondary | ICD-10-CM | POA: Diagnosis not present

## 2023-04-03 DIAGNOSIS — Z1331 Encounter for screening for depression: Secondary | ICD-10-CM | POA: Diagnosis not present

## 2023-04-03 DIAGNOSIS — I10 Essential (primary) hypertension: Secondary | ICD-10-CM | POA: Diagnosis not present

## 2023-04-03 DIAGNOSIS — I872 Venous insufficiency (chronic) (peripheral): Secondary | ICD-10-CM | POA: Diagnosis not present

## 2023-04-03 DIAGNOSIS — T466X5A Adverse effect of antihyperlipidemic and antiarteriosclerotic drugs, initial encounter: Secondary | ICD-10-CM | POA: Diagnosis not present

## 2023-04-03 DIAGNOSIS — G720 Drug-induced myopathy: Secondary | ICD-10-CM | POA: Diagnosis not present

## 2023-04-03 DIAGNOSIS — E559 Vitamin D deficiency, unspecified: Secondary | ICD-10-CM | POA: Diagnosis not present

## 2023-04-03 DIAGNOSIS — E663 Overweight: Secondary | ICD-10-CM | POA: Diagnosis not present

## 2023-04-03 DIAGNOSIS — C50412 Malignant neoplasm of upper-outer quadrant of left female breast: Secondary | ICD-10-CM | POA: Diagnosis not present

## 2023-04-03 DIAGNOSIS — N1832 Chronic kidney disease, stage 3b: Secondary | ICD-10-CM | POA: Diagnosis not present

## 2023-04-03 DIAGNOSIS — R7301 Impaired fasting glucose: Secondary | ICD-10-CM | POA: Diagnosis not present

## 2023-04-03 DIAGNOSIS — E785 Hyperlipidemia, unspecified: Secondary | ICD-10-CM | POA: Diagnosis not present

## 2023-04-03 DIAGNOSIS — Z0000 Encounter for general adult medical examination without abnormal findings: Secondary | ICD-10-CM | POA: Diagnosis not present

## 2023-06-21 ENCOUNTER — Telehealth: Payer: Self-pay

## 2023-06-21 NOTE — Telephone Encounter (Signed)
 VOB submitted for Monovisc, right knee

## 2023-07-13 ENCOUNTER — Ambulatory Visit: Payer: Medicare Other | Admitting: Orthopaedic Surgery

## 2023-07-13 ENCOUNTER — Other Ambulatory Visit: Payer: Self-pay

## 2023-07-13 ENCOUNTER — Encounter: Payer: Self-pay | Admitting: Orthopaedic Surgery

## 2023-07-13 DIAGNOSIS — M1711 Unilateral primary osteoarthritis, right knee: Secondary | ICD-10-CM

## 2023-07-13 DIAGNOSIS — G8929 Other chronic pain: Secondary | ICD-10-CM

## 2023-07-13 DIAGNOSIS — M25562 Pain in left knee: Secondary | ICD-10-CM

## 2023-07-13 DIAGNOSIS — M170 Bilateral primary osteoarthritis of knee: Secondary | ICD-10-CM | POA: Diagnosis not present

## 2023-07-13 DIAGNOSIS — M1712 Unilateral primary osteoarthritis, left knee: Secondary | ICD-10-CM

## 2023-07-13 MED ORDER — HYALURONAN 88 MG/4ML IX SOSY
88.0000 mg | PREFILLED_SYRINGE | INTRA_ARTICULAR | Status: AC | PRN
Start: 2023-07-13 — End: 2023-07-13
  Administered 2023-07-13: 88 mg via INTRA_ARTICULAR

## 2023-07-13 NOTE — Progress Notes (Signed)
   Procedure Note  Patient: Beth [name]             Date of Birth: 09-21-1940           MRN: [id number]             Visit Date: 07/13/2023  Procedures: Visit Diagnoses:  1. Unilateral primary osteoarthritis, right knee     Large Joint Inj: R knee on 07/13/2023 2:52 PM Indications: diagnostic evaluation and pain Details: 22 G 1.5 in needle, superolateral approach  Arthrogram: No  Medications: 88 mg Hyaluronan 88 MG/4ML Outcome: tolerated well, no immediate complications Procedure, treatment alternatives, risks and benefits explained, specific risks discussed. Consent was given by the patient. Immediately prior to procedure a time out was called to verify the correct patient, procedure, equipment, support staff and site/side marked as required. Patient was prepped and draped in the usual sterile fashion.    Large Joint Inj: L knee on 07/13/2023 2:56 PM Indications: diagnostic evaluation and pain Details: 22 G 1.5 in needle, superolateral approach  Arthrogram: No  Medications: 88 mg Hyaluronan 88 MG/4ML Outcome: tolerated well, no immediate complications Procedure, treatment alternatives, risks and benefits explained, specific risks discussed. Consent was given by the patient. Immediately prior to procedure a time out was called to verify the correct patient, procedure, equipment, support staff and site/side marked as required. Patient was prepped and draped in the usual sterile fashion.     The patient comes in today for regular scheduled right knee hyaluronic acid injection with Monovisc but she has been dealing with left knee pain and she like to be evaluated for this knee today as well.  Her right knee has known severe arthritis.  When I was at her review x-rays of her left knee that were done back in 2019, it showed varus malalignment with significant medial joint space narrowing of that knee.  She is only ever requested hyaluronic acid injections because that is helped her  the most to treat the pain from osteoarthritis.  She usually comes in every 6 months to sometimes a year for these injections.  She is not a diabetic.  She is not obese.  She gets around well she states.  She has no significant active medical issues at all.  Since the injections are working still, she would rather have injections then consider knee replacement surgery.  Both knees today show slight varus malalignment.  There is little bit more swelling on the right knee than the left knee but overall nothing to really drain from the knees.  They both have medial joint line tenderness and pain throughout the arc of motion of the knee with some patellofemoral crepitation.  I did place Monovisc today in both knees that she tolerated well.  She knows to wait at least 6 months minimal for repeat injections.  Lot number: [id number]

## 2023-10-04 DIAGNOSIS — E785 Hyperlipidemia, unspecified: Secondary | ICD-10-CM | POA: Diagnosis not present

## 2023-10-04 DIAGNOSIS — R7301 Impaired fasting glucose: Secondary | ICD-10-CM | POA: Diagnosis not present

## 2023-10-04 DIAGNOSIS — Z9012 Acquired absence of left breast and nipple: Secondary | ICD-10-CM | POA: Diagnosis not present

## 2023-10-04 DIAGNOSIS — N1832 Chronic kidney disease, stage 3b: Secondary | ICD-10-CM | POA: Diagnosis not present

## 2023-10-04 DIAGNOSIS — I129 Hypertensive chronic kidney disease with stage 1 through stage 4 chronic kidney disease, or unspecified chronic kidney disease: Secondary | ICD-10-CM | POA: Diagnosis not present

## 2023-10-04 DIAGNOSIS — M1990 Unspecified osteoarthritis, unspecified site: Secondary | ICD-10-CM | POA: Diagnosis not present

## 2023-10-04 DIAGNOSIS — G720 Drug-induced myopathy: Secondary | ICD-10-CM | POA: Diagnosis not present

## 2023-12-13 ENCOUNTER — Other Ambulatory Visit: Payer: Self-pay | Admitting: Internal Medicine

## 2023-12-13 DIAGNOSIS — Z1231 Encounter for screening mammogram for malignant neoplasm of breast: Secondary | ICD-10-CM

## 2024-01-10 ENCOUNTER — Ambulatory Visit
Admission: RE | Admit: 2024-01-10 | Discharge: 2024-01-10 | Disposition: A | Discharge status: Home / Self Care | Source: Ambulatory Visit | Attending: Internal Medicine | Admitting: Internal Medicine

## 2024-01-10 DIAGNOSIS — Z1231 Encounter for screening mammogram for malignant neoplasm of breast: Secondary | ICD-10-CM

## 2024-01-31 DIAGNOSIS — I10 Essential (primary) hypertension: Secondary | ICD-10-CM | POA: Diagnosis not present

## 2024-01-31 DIAGNOSIS — G542 Cervical root disorders, not elsewhere classified: Secondary | ICD-10-CM | POA: Diagnosis not present

## 2024-01-31 DIAGNOSIS — R202 Paresthesia of skin: Secondary | ICD-10-CM | POA: Diagnosis not present

## 2024-01-31 DIAGNOSIS — M542 Cervicalgia: Secondary | ICD-10-CM | POA: Diagnosis not present

## 2024-02-21 ENCOUNTER — Encounter: Payer: Self-pay | Admitting: Orthopaedic Surgery

## 2024-02-21 ENCOUNTER — Ambulatory Visit: Admitting: Orthopaedic Surgery

## 2024-02-21 DIAGNOSIS — M170 Bilateral primary osteoarthritis of knee: Secondary | ICD-10-CM

## 2024-02-21 DIAGNOSIS — M1711 Unilateral primary osteoarthritis, right knee: Secondary | ICD-10-CM

## 2024-02-21 DIAGNOSIS — M1712 Unilateral primary osteoarthritis, left knee: Secondary | ICD-10-CM

## 2024-02-21 MED ORDER — HYALURONAN 88 MG/4ML IX SOSY
88.0000 mg | PREFILLED_SYRINGE | INTRA_ARTICULAR | Status: AC | PRN
Start: 2024-02-21 — End: 2024-02-21
  Administered 2024-02-21: 88 mg via INTRA_ARTICULAR

## 2024-02-21 NOTE — Progress Notes (Signed)
   Procedure Note  Patient: Beth [name]             Date of Birth: 05-Feb-1941           MRN: [id number]             Visit Date: 02/21/2024  Procedures: Visit Diagnoses:  1. Unilateral primary osteoarthritis, right knee   2. Unilateral primary osteoarthritis, left knee     Large Joint Inj: R knee on 02/21/2024 3:21 PM Indications: diagnostic evaluation and pain Details: 22 G 1.5 in needle, superolateral approach  Arthrogram: No  Medications: 88 mg Hyaluronan 88 MG/4ML Outcome: tolerated well, no immediate complications Procedure, treatment alternatives, risks and benefits explained, specific risks discussed. Consent was given by the patient. Immediately prior to procedure a time out was called to verify the correct patient, procedure, equipment, support staff and site/side marked as required. Patient was prepped and draped in the usual sterile fashion.    Large Joint Inj: L knee on 02/21/2024 3:21 PM Indications: diagnostic evaluation and pain Details: 22 G 1.5 in needle, superolateral approach  Arthrogram: No  Medications: 88 mg Hyaluronan 88 MG/4ML Outcome: tolerated well, no immediate complications Procedure, treatment alternatives, risks and benefits explained, specific risks discussed. Consent was given by the patient. Immediately prior to procedure a time out was called to verify the correct patient, procedure, equipment, support staff and site/side marked as required. Patient was prepped and draped in the usual sterile fashion.     The patient is here today for bilateral knee injections with hyaluronic acid to treat the pain from osteoarthritis.  She gets these about every 6 months and so far she says they last about 6 months.  She goes slow and walks without an assistive ice and takes her time appropriately.  She has had no acute change in her medical status.  Both knees were examined and showed no effusion today.  She has pain throughout the arc of motion of both  knees and obvious arthritis.  We did place Monovisc in both knees today which she tolerated well.  She knows that she can have this repeated in 6 months if needed.  She will call us .  Lot #[id number]

## 2024-03-07 DIAGNOSIS — L814 Other melanin hyperpigmentation: Secondary | ICD-10-CM | POA: Diagnosis not present

## 2024-03-07 DIAGNOSIS — L578 Other skin changes due to chronic exposure to nonionizing radiation: Secondary | ICD-10-CM | POA: Diagnosis not present

## 2024-03-07 DIAGNOSIS — D2272 Melanocytic nevi of left lower limb, including hip: Secondary | ICD-10-CM | POA: Diagnosis not present

## 2024-03-07 DIAGNOSIS — L570 Actinic keratosis: Secondary | ICD-10-CM | POA: Diagnosis not present

## 2024-03-07 DIAGNOSIS — Z85828 Personal history of other malignant neoplasm of skin: Secondary | ICD-10-CM | POA: Diagnosis not present

## 2024-03-07 DIAGNOSIS — D225 Melanocytic nevi of trunk: Secondary | ICD-10-CM | POA: Diagnosis not present

## 2024-03-07 DIAGNOSIS — L820 Inflamed seborrheic keratosis: Secondary | ICD-10-CM | POA: Diagnosis not present

## 2024-03-07 DIAGNOSIS — L821 Other seborrheic keratosis: Secondary | ICD-10-CM | POA: Diagnosis not present

## 2024-03-20 DIAGNOSIS — Z23 Encounter for immunization: Secondary | ICD-10-CM | POA: Diagnosis not present

## 2024-04-03 DIAGNOSIS — M810 Age-related osteoporosis without current pathological fracture: Secondary | ICD-10-CM | POA: Diagnosis not present

## 2024-04-03 DIAGNOSIS — R7301 Impaired fasting glucose: Secondary | ICD-10-CM | POA: Diagnosis not present

## 2024-04-03 DIAGNOSIS — E785 Hyperlipidemia, unspecified: Secondary | ICD-10-CM | POA: Diagnosis not present

## 2024-04-03 DIAGNOSIS — I129 Hypertensive chronic kidney disease with stage 1 through stage 4 chronic kidney disease, or unspecified chronic kidney disease: Secondary | ICD-10-CM | POA: Diagnosis not present

## 2024-04-03 DIAGNOSIS — Z1389 Encounter for screening for other disorder: Secondary | ICD-10-CM | POA: Diagnosis not present

## 2024-04-10 DIAGNOSIS — N1832 Chronic kidney disease, stage 3b: Secondary | ICD-10-CM | POA: Diagnosis not present

## 2024-04-10 DIAGNOSIS — I129 Hypertensive chronic kidney disease with stage 1 through stage 4 chronic kidney disease, or unspecified chronic kidney disease: Secondary | ICD-10-CM | POA: Diagnosis not present

## 2024-04-10 DIAGNOSIS — Z1339 Encounter for screening examination for other mental health and behavioral disorders: Secondary | ICD-10-CM | POA: Diagnosis not present

## 2024-04-10 DIAGNOSIS — R82998 Other abnormal findings in urine: Secondary | ICD-10-CM | POA: Diagnosis not present

## 2024-04-10 DIAGNOSIS — E785 Hyperlipidemia, unspecified: Secondary | ICD-10-CM | POA: Diagnosis not present

## 2024-04-10 DIAGNOSIS — M1990 Unspecified osteoarthritis, unspecified site: Secondary | ICD-10-CM | POA: Diagnosis not present

## 2024-04-10 DIAGNOSIS — Z1331 Encounter for screening for depression: Secondary | ICD-10-CM | POA: Diagnosis not present

## 2024-04-10 DIAGNOSIS — Z0000 Encounter for general adult medical examination without abnormal findings: Secondary | ICD-10-CM | POA: Diagnosis not present

## 2024-08-19 ENCOUNTER — Ambulatory Visit: Admitting: Orthopaedic Surgery
